# Patient Record
Sex: Female | Born: 1960
Health system: Southern US, Community
[De-identification: ages and names within clinical notes are randomized; demographics above are authoritative.]

## PROBLEM LIST (undated history)

## (undated) DIAGNOSIS — M1990 Unspecified osteoarthritis, unspecified site: Secondary | ICD-10-CM

## (undated) DIAGNOSIS — F419 Anxiety disorder, unspecified: Secondary | ICD-10-CM

## (undated) DIAGNOSIS — M722 Plantar fascial fibromatosis: Secondary | ICD-10-CM

## (undated) DIAGNOSIS — F329 Major depressive disorder, single episode, unspecified: Secondary | ICD-10-CM

## (undated) DIAGNOSIS — Z889 Allergy status to unspecified drugs, medicaments and biological substances status: Secondary | ICD-10-CM

## (undated) DIAGNOSIS — Z87440 Personal history of urinary (tract) infections: Secondary | ICD-10-CM

## (undated) DIAGNOSIS — F410 Panic disorder [episodic paroxysmal anxiety] without agoraphobia: Secondary | ICD-10-CM

## (undated) DIAGNOSIS — F32A Depression, unspecified: Secondary | ICD-10-CM

## (undated) HISTORY — DX: Allergy status to unspecified drugs, medicaments and biological substances: Z88.9

## (undated) HISTORY — DX: Plantar fascial fibromatosis: M72.2

## (undated) HISTORY — PX: CERVICAL CONE BIOPSY: SUR198

---
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
## 1983-08-30 HISTORY — PX: WISDOM TOOTH EXTRACTION: SHX21

## 1987-08-30 HISTORY — PX: REFRACTIVE SURGERY: SHX103

## 2010-02-18 ENCOUNTER — Ambulatory Visit: Payer: Self-pay | Admitting: Sports Medicine

## 2010-02-18 DIAGNOSIS — M79609 Pain in unspecified limb: Secondary | ICD-10-CM

## 2010-09-28 NOTE — Assessment & Plan Note (Signed)
Summary: NP,BILATERAL FOOT PAIN,MC   Vital Signs:  Patient profile:   50 year old female Height:      63 inches Weight:      146 pounds BMI:     25.96 BP sitting:   144 / 96  Vitals Entered By: Lillia Pauls CMA (February 18, 2010 8:48 AM)  History of Present Illness: 50 yo F here for bilateral foot pain.  Patient reports starting a new job several weeks ago. Concurrent with this for past 5 weeks has had bilateral foot pain from ankles distally worse as day goes on. 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 No known injury Tingling feeling in bilateral feet moreso on the bottoms with this Has tried icing and ibuprofen. Working 8 hour shifts 5 days a week. Impacting her ability to go to the gym. Has tried some dr. Jari Sportsman inserts with a little arch support - feels best in these. Also has pair of sneakers that she feels the best in. Feet more swollen at end of the day. No remote history of foot/ankle surgery or injuries.  Allergies (verified): No Known Drug Allergies  Physical Exam  General:  Well-developed,well-nourished,in no acute distress; alert,appropriate and cooperative throughout examination Msk:  Bilateral ankles/feet: No gross deformity, swelling, or bruising. No focal TTP bilateral feet or ankles (admits no pain currently). FROM ankles and toes. Neg ant drawer and talar tilt. NVI distally Mild cavus feet. Neg tinels bilateral tarsal tunnels.   Impression & Recommendations:  Problem # 1:  FOOT PAIN, BILATERAL (ICD-729.5) Assessment New  No pain currently and no obvious abnormalities.  Related to work where on her feet on concrete all day.  Cushion and arch support only changes to likely be of benefit - states no other job opportunities there and no light duty.  Unable to modify her work - sit as much as possible with feet elevation.  Comforthotics provided - too much supination with scaphoid pads added.  Orders: [employer] [id number])

## 2011-02-05 LAB — HM PAP SMEAR

## 2013-02-04 ENCOUNTER — Ambulatory Visit (INDEPENDENT_AMBULATORY_CARE_PROVIDER_SITE_OTHER): Payer: BC Managed Care – PPO | Admitting: Family Medicine

## 2013-02-04 ENCOUNTER — Encounter: Payer: Self-pay | Admitting: Family Medicine

## 2013-02-04 VITALS — BP 106/74 | HR 68 | Temp 98.9°F | Ht 64.0 in | Wt 150.4 lb

## 2013-02-04 DIAGNOSIS — Z0000 Encounter for general adult medical examination without abnormal findings: Secondary | ICD-10-CM

## 2013-02-04 DIAGNOSIS — F4001 Agoraphobia with panic disorder: Secondary | ICD-10-CM

## 2013-02-04 MED ORDER — ALPRAZOLAM 0.25 MG PO TABS: ORAL_TABLET | ORAL | Status: DC

## 2013-02-04 MED ORDER — AZELASTINE-FLUTICASONE 137-50 MCG/ACT NA SUSP: 1.0000 | Freq: Two times a day (BID) | NASAL | Status: DC

## 2013-02-04 NOTE — Assessment & Plan Note (Signed)
con't with counseling and meds Xanax given to take when she does decide to donate blood in order to try to get over fear of IV

## 2013-02-04 NOTE — Patient Instructions (Addendum)
Preventive Care for Adults, Female A healthy lifestyle and preventive care can promote health and wellness. Preventive health guidelines for women include the following key practices.  A routine yearly physical is a good way to check with your caregiver about your health and preventive screening. It is a chance to share any concerns and updates on your health, and to receive a thorough exam.  Visit your dentist for a routine exam and preventive care every 6 months. Brush your teeth twice a day and floss once a day. Good oral hygiene prevents tooth decay and gum disease.  The frequency of eye exams is based on your age, health, family medical history, use of contact lenses, and other factors. Follow your caregiver's recommendations for frequency of eye exams.  Eat a healthy diet. Foods like vegetables, fruits, whole grains, low-fat dairy products, and lean protein foods contain the nutrients you need without too many calories. Decrease your intake of foods high in solid fats, added sugars, and salt. Eat the right amount of calories for you.Get information about a proper diet from your caregiver, if necessary.  Regular physical exercise is one of the most important things you can do for your health. Most adults should get at least 150 minutes of moderate-intensity exercise (any activity that increases your heart rate and causes you to sweat) each week. In addition, most adults need muscle-strengthening exercises on 2 or more days a week.  Maintain a healthy weight. The body mass index (BMI) is a screening tool to identify possible weight problems. It provides an estimate of body fat based on height and weight. Your caregiver can help determine your BMI, and can help you achieve or maintain a healthy weight.For adults 20 years and older:  A BMI below 18.5 is considered underweight.  A BMI of 18.5 to 24.9 is normal.  A BMI of 25 to 29.9 is considered overweight.  A BMI of 30 and above is  considered obese.  Maintain normal blood lipids and cholesterol levels by exercising and minimizing your intake of saturated fat. Eat a balanced diet with plenty of fruit and vegetables. Blood tests for lipids and cholesterol should begin at age 20 and be repeated every 5 years. If your lipid or cholesterol levels are high, you are over 50, or you are at high risk for heart disease, you may need your cholesterol levels checked more frequently.Ongoing high lipid and cholesterol levels should be treated with medicines if diet and exercise are not effective.  If you smoke, find out from your caregiver how to quit. If you do not use tobacco, do not start.  If you are pregnant, do not drink alcohol. If you are breastfeeding, be very cautious about drinking alcohol. If you are not pregnant and choose to drink alcohol, do not exceed 1 drink per day. One drink is considered to be 12 ounces (355 mL) of beer, 5 ounces (148 mL) of wine, or 1.5 ounces (44 mL) of liquor.  Avoid use of street drugs. Do not share needles with anyone. Ask for help if you need support or instructions about stopping the use of drugs.  High blood pressure causes heart disease and increases the risk of stroke. Your blood pressure should be checked at least every 1 to 2 years. Ongoing high blood pressure should be treated with medicines if weight loss and exercise are not effective.  If you are 55 to 50 years old, ask your caregiver if you should take aspirin to prevent strokes.  Diabetes   screening involves taking a blood sample to check your fasting blood sugar level. This should be done once every 3 years, after age 45, if you are within normal weight and without risk factors for diabetes. Testing should be considered at a younger age or be carried out more frequently if you are overweight and have at least 1 risk factor for diabetes.  Breast cancer screening is essential preventive care for women. You should practice "breast  self-awareness." This means understanding the normal appearance and feel of your breasts and may include breast self-examination. Any changes detected, no matter how small, should be reported to a caregiver. Women in their 20s and 30s should have a clinical breast exam (CBE) by a caregiver as part of a regular health exam every 1 to 3 years. After age 40, women should have a CBE every year. Starting at age 40, women should consider having a mammography (breast X-ray test) every year. Women who have a family history of breast cancer should talk to their caregiver about genetic screening. Women at a high risk of breast cancer should talk to their caregivers about having magnetic resonance imaging (MRI) and a mammography every year.  The Pap test is a screening test for cervical cancer. A Pap test can show cell changes on the cervix that might become cervical cancer if left untreated. A Pap test is a procedure in which cells are obtained and examined from the lower end of the uterus (cervix).  Women should have a Pap test starting at age 21.  Between ages 21 and 29, Pap tests should be repeated every 2 years.  Beginning at age 30, you should have a Pap test every 3 years as long as the past 3 Pap tests have been normal.  Some women have medical problems that increase the chance of getting cervical cancer. Talk to your caregiver about these problems. It is especially important to talk to your caregiver if a new problem develops soon after your last Pap test. In these cases, your caregiver may recommend more frequent screening and Pap tests.  The above recommendations are the same for women who have or have not gotten the vaccine for human papillomavirus (HPV).  If you had a hysterectomy for a problem that was not cancer or a condition that could lead to cancer, then you no longer need Pap tests. Even if you no longer need a Pap test, a regular exam is a good idea to make sure no other problems are  starting.  If you are between ages 65 and 70, and you have had normal Pap tests going back 10 years, you no longer need Pap tests. Even if you no longer need a Pap test, a regular exam is a good idea to make sure no other problems are starting.  If you have had past treatment for cervical cancer or a condition that could lead to cancer, you need Pap tests and screening for cancer for at least 20 years after your treatment.  If Pap tests have been discontinued, risk factors (such as a new sexual partner) need to be reassessed to determine if screening should be resumed.  The HPV test is an additional test that may be used for cervical cancer screening. The HPV test looks for the virus that can cause the cell changes on the cervix. The cells collected during the Pap test can be tested for HPV. The HPV test could be used to screen women aged 30 years and older, and should   be used in women of any age who have unclear Pap test results. After the age of 30, women should have HPV testing at the same frequency as a Pap test.  Colorectal cancer can be detected and often prevented. Most routine colorectal cancer screening begins at the age of 50 and continues through age 75. However, your caregiver may recommend screening at an earlier age if you have risk factors for colon cancer. On a yearly basis, your caregiver may provide home test kits to check for hidden blood in the stool. Use of a small camera at the end of a tube, to directly examine the colon (sigmoidoscopy or colonoscopy), can detect the earliest forms of colorectal cancer. Talk to your caregiver about this at age 50, when routine screening begins. Direct examination of the colon should be repeated every 5 to 10 years through age 75, unless early forms of pre-cancerous polyps or small growths are found.  Hepatitis C blood testing is recommended for all people born from 1945 through 1965 and any individual with known risks for hepatitis C.  Practice  safe sex. Use condoms and avoid high-risk sexual practices to reduce the spread of sexually transmitted infections (STIs). STIs include gonorrhea, chlamydia, syphilis, trichomonas, herpes, HPV, and human immunodeficiency virus (HIV). Herpes, HIV, and HPV are viral illnesses that have no cure. They can result in disability, cancer, and death. Sexually active women aged 25 and younger should be checked for chlamydia. Older women with new or multiple partners should also be tested for chlamydia. Testing for other STIs is recommended if you are sexually active and at increased risk.  Osteoporosis is a disease in which the bones lose minerals and strength with aging. This can result in serious bone fractures. The risk of osteoporosis can be identified using a bone density scan. Women ages 65 and over and women at risk for fractures or osteoporosis should discuss screening with their caregivers. Ask your caregiver whether you should take a calcium supplement or vitamin D to reduce the rate of osteoporosis.  Menopause can be associated with physical symptoms and risks. Hormone replacement therapy is available to decrease symptoms and risks. You should talk to your caregiver about whether hormone replacement therapy is right for you.  Use sunscreen with sun protection factor (SPF) of 30 or more. Apply sunscreen liberally and repeatedly throughout the day. You should seek shade when your shadow is shorter than you. Protect yourself by wearing long sleeves, pants, a wide-brimmed hat, and sunglasses year round, whenever you are outdoors.  Once a month, do a whole body skin exam, using a mirror to look at the skin on your back. Notify your caregiver of new moles, moles that have irregular borders, moles that are larger than a pencil eraser, or moles that have changed in shape or color.  Stay current with required immunizations.  Influenza. You need a dose every fall (or winter). The composition of the flu vaccine  changes each year, so being vaccinated once is not enough.  Pneumococcal polysaccharide. You need 1 to 2 doses if you smoke cigarettes or if you have certain chronic medical conditions. You need 1 dose at age 65 (or older) if you have never been vaccinated.  Tetanus, diphtheria, pertussis (Tdap, Td). Get 1 dose of Tdap vaccine if you are younger than age 65, are over 65 and have contact with an infant, are a healthcare worker, are pregnant, or simply want to be protected from whooping cough. After that, you need a Td   booster dose every 10 years. Consult your caregiver if you have not had at least 3 tetanus and diphtheria-containing shots sometime in your life or have a deep or dirty wound.  HPV. You need this vaccine if you are a woman age 26 or younger. The vaccine is given in 3 doses over 6 months.  Measles, mumps, rubella (MMR). You need at least 1 dose of MMR if you were born in 1957 or later. You may also need a second dose.  Meningococcal. If you are age 19 to 21 and a first-year college student living in a residence hall, or have one of several medical conditions, you need to get vaccinated against meningococcal disease. You may also need additional booster doses.  Zoster (shingles). If you are age 60 or older, you should get this vaccine.  Varicella (chickenpox). If you have never had chickenpox or you were vaccinated but received only 1 dose, talk to your caregiver to find out if you need this vaccine.  Hepatitis A. You need this vaccine if you have a specific risk factor for hepatitis A virus infection or you simply wish to be protected from this disease. The vaccine is usually given as 2 doses, 6 to 18 months apart.  Hepatitis B. You need this vaccine if you have a specific risk factor for hepatitis B virus infection or you simply wish to be protected from this disease. The vaccine is given in 3 doses, usually over 6 months. Preventive Services / Frequency Ages 19 to 39  Blood  pressure check.** / Every 1 to 2 years.  Lipid and cholesterol check.** / Every 5 years beginning at age 20.  Clinical breast exam.** / Every 3 years for women in their 20s and 30s.  Pap test.** / Every 2 years from ages 21 through 29. Every 3 years starting at age 30 through age 65 or 70 with a history of 3 consecutive normal Pap tests.  HPV screening.** / Every 3 years from ages 30 through ages 65 to 70 with a history of 3 consecutive normal Pap tests.  Hepatitis C blood test.** / For any individual with known risks for hepatitis C.  Skin self-exam. / Monthly.  Influenza immunization.** / Every year.  Pneumococcal polysaccharide immunization.** / 1 to 2 doses if you smoke cigarettes or if you have certain chronic medical conditions.  Tetanus, diphtheria, pertussis (Tdap, Td) immunization. / A one-time dose of Tdap vaccine. After that, you need a Td booster dose every 10 years.  HPV immunization. / 3 doses over 6 months, if you are 26 and younger.  Measles, mumps, rubella (MMR) immunization. / You need at least 1 dose of MMR if you were born in 1957 or later. You may also need a second dose.  Meningococcal immunization. / 1 dose if you are age 19 to 21 and a first-year college student living in a residence hall, or have one of several medical conditions, you need to get vaccinated against meningococcal disease. You may also need additional booster doses.  Varicella immunization.** / Consult your caregiver.  Hepatitis A immunization.** / Consult your caregiver. 2 doses, 6 to 18 months apart.  Hepatitis B immunization.** / Consult your caregiver. 3 doses usually over 6 months. Ages 40 to 64  Blood pressure check.** / Every 1 to 2 years.  Lipid and cholesterol check.** / Every 5 years beginning at age 20.  Clinical breast exam.** / Every year after age 40.  Mammogram.** / Every year beginning at age 40   and continuing for as long as you are in good health. Consult with your  caregiver.  Pap test.** / Every 3 years starting at age 30 through age 65 or 70 with a history of 3 consecutive normal Pap tests.  HPV screening.** / Every 3 years from ages 30 through ages 65 to 70 with a history of 3 consecutive normal Pap tests.  Fecal occult blood test (FOBT) of stool. / Every year beginning at age 50 and continuing until age 75. You may not need to do this test if you get a colonoscopy every 10 years.  Flexible sigmoidoscopy or colonoscopy.** / Every 5 years for a flexible sigmoidoscopy or every 10 years for a colonoscopy beginning at age 50 and continuing until age 75.  Hepatitis C blood test.** / For all people born from 1945 through 1965 and any individual with known risks for hepatitis C.  Skin self-exam. / Monthly.  Influenza immunization.** / Every year.  Pneumococcal polysaccharide immunization.** / 1 to 2 doses if you smoke cigarettes or if you have certain chronic medical conditions.  Tetanus, diphtheria, pertussis (Tdap, Td) immunization.** / A one-time dose of Tdap vaccine. After that, you need a Td booster dose every 10 years.  Measles, mumps, rubella (MMR) immunization. / You need at least 1 dose of MMR if you were born in 1957 or later. You may also need a second dose.  Varicella immunization.** / Consult your caregiver.  Meningococcal immunization.** / Consult your caregiver.  Hepatitis A immunization.** / Consult your caregiver. 2 doses, 6 to 18 months apart.  Hepatitis B immunization.** / Consult your caregiver. 3 doses, usually over 6 months. Ages 65 and over  Blood pressure check.** / Every 1 to 2 years.  Lipid and cholesterol check.** / Every 5 years beginning at age 20.  Clinical breast exam.** / Every year after age 40.  Mammogram.** / Every year beginning at age 40 and continuing for as long as you are in good health. Consult with your caregiver.  Pap test.** / Every 3 years starting at age 30 through age 65 or 70 with a 3  consecutive normal Pap tests. Testing can be stopped between 65 and 70 with 3 consecutive normal Pap tests and no abnormal Pap or HPV tests in the past 10 years.  HPV screening.** / Every 3 years from ages 30 through ages 65 or 70 with a history of 3 consecutive normal Pap tests. Testing can be stopped between 65 and 70 with 3 consecutive normal Pap tests and no abnormal Pap or HPV tests in the past 10 years.  Fecal occult blood test (FOBT) of stool. / Every year beginning at age 50 and continuing until age 75. You may not need to do this test if you get a colonoscopy every 10 years.  Flexible sigmoidoscopy or colonoscopy.** / Every 5 years for a flexible sigmoidoscopy or every 10 years for a colonoscopy beginning at age 50 and continuing until age 75.  Hepatitis C blood test.** / For all people born from 1945 through 1965 and any individual with known risks for hepatitis C.  Osteoporosis screening.** / A one-time screening for women ages 65 and over and women at risk for fractures or osteoporosis.  Skin self-exam. / Monthly.  Influenza immunization.** / Every year.  Pneumococcal polysaccharide immunization.** / 1 dose at age 65 (or older) if you have never been vaccinated.  Tetanus, diphtheria, pertussis (Tdap, Td) immunization. / A one-time dose of Tdap vaccine if you are over   65 and have contact with an infant, are a healthcare worker, or simply want to be protected from whooping cough. After that, you need a Td booster dose every 10 years.  Varicella immunization.** / Consult your caregiver.  Meningococcal immunization.** / Consult your caregiver.  Hepatitis A immunization.** / Consult your caregiver. 2 doses, 6 to 18 months apart.  Hepatitis B immunization.** / Check with your caregiver. 3 doses, usually over 6 months. ** Family history and personal history of risk and conditions may change your caregiver's recommendations. Document Released: 10/11/2001 Document Revised: 11/07/2011  Document Reviewed: 01/10/2011 [employer] Patient Information 2014 [employer].  

## 2013-02-04 NOTE — Addendum Note (Signed)
Addended by: Lelon Perla on: 02/04/2013 02:01 PM   Modules accepted: Orders

## 2013-02-04 NOTE — Progress Notes (Signed)
Subjective:     Cynthia [name] is a 52 y.o. female and is here for a comprehensive physical exam. 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  History   Social History  . Marital Status: Married    Spouse Name: N/A    Number of Children: N/A  . Years of Education: N/A   Occupational History  .      physical therapy   Social History Main Topics  . Smoking status: Never Smoker   . Smokeless tobacco: Never Used  . Alcohol Use: No  . Drug Use: No  . Sexually Active: Yes -- Female partner(s)   Other Topics Concern  . Not on file   Social History Narrative   Exercise--gym 5-6 days a week   No health maintenance topics applied.  The following portions of the patient's history were reviewed and updated as appropriate:  She  has a past medical history of Multiple allergies. She  does not have any pertinent problems on file. She  has past surgical history that includes Appendectomy (1974); Wisdom tooth extraction (1985); and Refractive surgery (1989). Her family history includes Diabetes (age of onset: 23) in her father and Hypertension in her mother. She  reports that she has never smoked. She has never used smokeless tobacco. She reports that she does not drink alcohol or use illicit drugs. She has a current medication list which includes the following prescription(s): chlorpheniramine maleate, citalopram, lidocaine, and multiple vitamins-minerals. No current outpatient prescriptions on file prior to visit.   No current facility-administered medications on file prior to visit.   She has No Known Allergies..  Review of Systems Review of Systems  Constitutional: Negative for activity change, appetite change and fatigue.  HENT: Negative for hearing loss, congestion, tinnitus and ear discharge.  dentist q64m Eyes: Negative for visual disturbance (see optho q1y -- vision corrected to 20/20 with glasses).  Respiratory: Negative for cough, chest tightness and shortness of breath.    Cardiovascular: Negative for chest pain, palpitations and leg swelling.  Gastrointestinal: Negative for abdominal pain, diarrhea, constipation and abdominal distention.  Genitourinary: Negative for urgency, frequency, decreased urine volume and difficulty urinating.  Musculoskeletal: Negative for back pain, arthralgias and gait problem.  Skin: Negative for color change, pallor and rash.  Neurological: Negative for dizziness, light-headedness, numbness and headaches.  Hematological: Negative for adenopathy. Does not bruise/bleed easily.  Psychiatric/Behavioral: Negative for suicidal ideas, confusion, sleep disturbance, self-injury, dysphoric mood, decreased concentration and agitation.      Objective:    BP 106/74  Pulse 68  Temp(Src) 98.9 F (37.2 C) (Oral)  Ht 5\' 4"  (1.626 m)  Wt 150 lb 6.4 oz (68.221 kg)  BMI 25.8 kg/m2  SpO2 97% General appearance: alert, cooperative, appears stated age and no distress Head: Normocephalic, without obvious abnormality, atraumatic Eyes: conjunctivae/corneas clear. PERRL, EOM's intact. Fundi benign. Ears: normal TM's and external ear canals both ears Nose: Nares normal. Septum midline. Mucosa normal. No drainage or sinus tenderness. Throat: lips, mucosa, and tongue normal; teeth and gums normal Neck: no adenopathy, no carotid bruit, no JVD, supple, symmetrical, trachea midline and thyroid not enlarged, symmetric, no tenderness/mass/nodules Back: symmetric, no curvature. ROM normal. No CVA tenderness. Lungs: clear to auscultation bilaterally Breasts: normal appearance, no masses or tenderness Heart: regular rate and rhythm, S1, S2 normal, no murmur, click, rub or gallop Abdomen: soft, non-tender; bowel sounds normal; no masses,  no organomegaly Pelvic: deferred Extremities: extremities normal, atraumatic, no cyanosis or edema Pulses: 2+ and symmetric Skin: Skin  color, texture, turgor normal. No rashes or lesions Lymph nodes: Cervical,  supraclavicular, and axillary nodes normal. Neurologic: Alert and oriented X 3, normal strength and tone. Normal symmetric reflexes. Normal coordination and gait Psych-- no depression--- Pt IV phobia      Assessment:    Healthy female exam.       Plan:    ghm utd Check labs See After Visit Summary for Counseling Recommendations

## 2013-07-29 ENCOUNTER — Telehealth: Payer: Self-pay | Admitting: Family Medicine

## 2013-07-29 NOTE — Telephone Encounter (Signed)
Last seen 02/04/13 Rx not prescribed at that time. Please advise       KP

## 2013-07-29 NOTE — Telephone Encounter (Signed)
Patent would need an apt per Dr.Lowne.       KP

## 2013-07-29 NOTE — Telephone Encounter (Signed)
She would need to come in to discuss

## 2013-07-29 NOTE — Telephone Encounter (Signed)
Patient is calling to request a new rx for Lidocaine patches. States that she has used them in the past.

## 2013-08-01 ENCOUNTER — Telehealth: Payer: Self-pay | Admitting: *Deleted

## 2013-08-01 ENCOUNTER — Other Ambulatory Visit: Payer: Self-pay | Admitting: *Deleted

## 2013-08-01 NOTE — Telephone Encounter (Signed)
Error

## 2013-08-01 NOTE — Telephone Encounter (Signed)
Patient returned call to office and was made aware that the prior authorization could not be done. She states that she fully understands and if she has another flare up she would then make an appointment.

## 2013-08-01 NOTE — Telephone Encounter (Signed)
Patient called today stating that she went [place] this week and was seen at an urgent care and was prescribed lidocaine patches. Patient states that she will need a prior authorization done for this medication and would need to send it to the [facility] on [address]. Called patient to advise her that we will not be able to to do that prior authorization because Dr. Laury Axon was not the prescribing physician. Left message for patient to return call office for me to explain this to her.

## 2014-04-21 ENCOUNTER — Encounter: Payer: Self-pay | Admitting: Family Medicine

## 2014-04-21 ENCOUNTER — Other Ambulatory Visit (HOSPITAL_COMMUNITY)
Admission: RE | Admit: 2014-04-21 | Discharge: 2014-04-21 | Disposition: A | Discharge status: Home / Self Care | Payer: BC Managed Care – PPO | Source: Ambulatory Visit | Attending: Family Medicine | Admitting: Family Medicine

## 2014-04-21 ENCOUNTER — Ambulatory Visit (INDEPENDENT_AMBULATORY_CARE_PROVIDER_SITE_OTHER): Payer: BC Managed Care – PPO | Admitting: Family Medicine

## 2014-04-21 ENCOUNTER — Other Ambulatory Visit: Payer: Self-pay

## 2014-04-21 VITALS — BP 108/70 | HR 70 | Temp 98.3°F | Ht 63.5 in | Wt 152.0 lb

## 2014-04-21 DIAGNOSIS — F419 Anxiety disorder, unspecified: Secondary | ICD-10-CM

## 2014-04-21 DIAGNOSIS — Z124 Encounter for screening for malignant neoplasm of cervix: Secondary | ICD-10-CM

## 2014-04-21 DIAGNOSIS — Z23 Encounter for immunization: Secondary | ICD-10-CM

## 2014-04-21 DIAGNOSIS — Z01419 Encounter for gynecological examination (general) (routine) without abnormal findings: Secondary | ICD-10-CM | POA: Diagnosis not present

## 2014-04-21 DIAGNOSIS — Z1151 Encounter for screening for human papillomavirus (HPV): Secondary | ICD-10-CM | POA: Insufficient documentation

## 2014-04-21 DIAGNOSIS — Z0000 Encounter for general adult medical examination without abnormal findings: Secondary | ICD-10-CM

## 2014-04-21 DIAGNOSIS — Z2911 Encounter for prophylactic immunotherapy for respiratory syncytial virus (RSV): Secondary | ICD-10-CM

## 2014-04-21 DIAGNOSIS — F411 Generalized anxiety disorder: Secondary | ICD-10-CM

## 2014-04-21 DIAGNOSIS — Z1231 Encounter for screening mammogram for malignant neoplasm of breast: Secondary | ICD-10-CM

## 2014-04-21 MED ORDER — CITALOPRAM HYDROBROMIDE 10 MG PO TABS: ORAL_TABLET | ORAL | Status: DC

## 2014-04-21 MED ORDER — NONFORMULARY OR COMPOUNDED ITEM: Status: DC

## 2014-04-21 NOTE — Addendum Note (Signed)
Addended by: Arnette Norris on: 04/21/2014 04:34 PM   Modules accepted: Orders

## 2014-04-21 NOTE — Progress Notes (Signed)
Pre visit review using our clinic review tool, if applicable. No additional management support is needed unless otherwise documented below in the visit note. 

## 2014-04-21 NOTE — Progress Notes (Addendum)
Subjective:     Cynthia [name] is a 53 y.o. female and is here for a comprehensive physical exam. The patient reports no problems.  History   Social History  . Marital Status: Married    Spouse Name: N/A    Number of Children: N/A  . Years of Education: N/A   Occupational History  .      physical therapy   Social History Main Topics  . Smoking status: Never Smoker   . Smokeless tobacco: Never Used  . Alcohol Use: No  . Drug Use: No  . Sexual Activity: Yes    Partners: Male   Other Topics Concern  . Not on file   Social History Narrative   Exercise--gym 5-6 days a week   Health Maintenance  Topic Date Due  . Colonoscopy  05/10/1981  . Mammogram  01/27/2013  . Pap Smear  02/04/2014  . Influenza Vaccine  05/22/2014 (Originally 03/29/2014)  . Tetanus/tdap  05/29/2022    The following portions of the patient's history were reviewed and updated as appropriate:  She  has a past medical history of Multiple allergies and Plantar fasciitis. She  does not have any pertinent problems on file. She  has past surgical history that includes Appendectomy (1974); Wisdom tooth extraction (1985); and Refractive surgery (1989). Her family history includes Diabetes (age of onset: 36) in her father; Hypertension in her mother. She  reports that she has never smoked. She has never used smokeless tobacco. She reports that she does not drink alcohol or use illicit drugs. She has a current medication list which includes the following prescription(s): alprazolam, citalopram, and multiple vitamins-minerals. Current Outpatient Prescriptions on File Prior to Visit  Medication Sig Dispense Refill  . ALPRAZolam (XANAX) 0.25 MG tablet 1 po 30 min before procedure and tid prn  30 tablet  0  . citalopram (CELEXA) 10 MG tablet Take 5-10 mg by mouth daily.      . Multiple Vitamins-Minerals (MULTIVITAMIN PO) Take 1 tablet by mouth daily.       No current facility-administered medications on file prior  to visit.   She has No Known Allergies..  Review of Systems Review of Systems  Constitutional: Negative for activity change, appetite change and fatigue.  HENT: Negative for hearing loss, congestion, tinnitus and ear discharge.  dentist q51m Eyes: Negative for visual disturbance (see optho q1y -- vision corrected to 20/20 with glasses).  Respiratory: Negative for cough, chest tightness and shortness of breath.   Cardiovascular: Negative for chest pain, palpitations and leg swelling.  Gastrointestinal: Negative for abdominal pain, diarrhea, constipation and abdominal distention.  Genitourinary: Negative for urgency, frequency, decreased urine volume and difficulty urinating.  Musculoskeletal: Negative for back pain, arthralgias and gait problem.  Skin: Negative for color change, pallor and rash.  Neurological: Negative for dizziness, light-headedness, numbness and headaches.  Hematological: Negative for adenopathy. Does not bruise/bleed easily.  Psychiatric/Behavioral: Negative for suicidal ideas, confusion, sleep disturbance, self-injury, dysphoric mood, decreased concentration and agitation.       Objective:    BP 108/70  Pulse 70  Temp(Src) 98.3 F (36.8 C) (Oral)  Ht 5' 3.5" (1.613 m)  Wt 152 lb (68.947 kg)  BMI 26.50 kg/m2  SpO2 98%  LMP 04/07/2014 General appearance: alert, cooperative, appears stated age and no distress Head: Normocephalic, without obvious abnormality, atraumatic Eyes: conjunctivae/corneas clear. PERRL, EOM's intact. Fundi benign. Ears: normal TM's and external ear canals both ears Nose: Nares normal. Septum midline. Mucosa normal. No drainage or sinus  tenderness. Throat: lips, mucosa, and tongue normal; teeth and gums normal Neck: no adenopathy, no carotid bruit, no JVD, supple, symmetrical, trachea midline and thyroid not enlarged, symmetric, no tenderness/mass/nodules Back: symmetric, no curvature. ROM normal. No CVA tenderness. Lungs: clear to  auscultation bilaterally Breasts: normal appearance, no masses or tenderness Heart: regular rate and rhythm, S1, S2 normal, no murmur, click, rub or gallop Abdomen: soft, non-tender; bowel sounds normal; no masses,  no organomegaly Pelvic: cervix normal in appearance, external genitalia normal, no adnexal masses or tenderness, no cervical motion tenderness, rectovaginal septum normal, uterus normal size, shape, and consistency, vagina normal without discharge and pap done, rectal -heme neg brown stool Extremities: extremities normal, atraumatic, no cyanosis or edema Pulses: 2+ and symmetric Skin: Skin color, texture, turgor normal. No rashes or lesions Lymph nodes: Cervical, supraclavicular, and axillary nodes normal. Neurologic: Alert and oriented X 3, normal strength and tone. Normal symmetric reflexes. Normal coordination and gait Psych- no anxiety      Assessment:    Healthy female exam.       Plan:     See After Visit Summary for Counseling Recommendations   1. Other screening mammogram   - MM Digital Screening; Future  2. Preventative health care Pt refusing colonoscopy at this time  - Basic metabolic panel - CBC with Differential - Hepatic function panel - Lipid panel - POCT urinalysis dipstick - TSH - NONFORMULARY OR COMPOUNDED ITEM; Cbcd, bmp, hep, lipid, TSH  uA  Dx V70.0  Dispense: 1 each; Refill: 0  3. Anxiety Refill celexa-- 1/2 10 mg tab - citalopram (CELEXA) 10 MG tablet; 1/2 tab po qd  Dispense: 45 tablet; Refill: 3

## 2014-04-21 NOTE — Patient Instructions (Signed)
Preventive Care for Adults A healthy lifestyle and preventive care can promote health and wellness. Preventive health guidelines for women include the following key practices.  A routine yearly physical is a good way to check with your health care provider about your health and preventive screening. It is a chance to share any concerns and updates on your health and to receive a thorough exam.  Visit your dentist for a routine exam and preventive care every 6 months. Brush your teeth twice a day and floss once a day. Good oral hygiene prevents tooth decay and gum disease.  The frequency of eye exams is based on your age, health, family medical history, use of contact lenses, and other factors. Follow your health care provider's recommendations for frequency of eye exams.  Eat a healthy diet. Foods like vegetables, fruits, whole grains, low-fat dairy products, and lean protein foods contain the nutrients you need without too many calories. Decrease your intake of foods high in solid fats, added sugars, and salt. Eat the right amount of calories for you.Get information about a proper diet from your health care provider, if necessary.  Regular physical exercise is one of the most important things you can do for your health. Most adults should get at least 150 minutes of moderate-intensity exercise (any activity that increases your heart rate and causes you to sweat) each week. In addition, most adults need muscle-strengthening exercises on 2 or more days a week.  Maintain a healthy weight. The body mass index (BMI) is a screening tool to identify possible weight problems. It provides an estimate of body fat based on height and weight. Your health care provider can find your BMI and can help you achieve or maintain a healthy weight.For adults 20 years and older:  A BMI below 18.5 is considered underweight.  A BMI of 18.5 to 24.9 is normal.  A BMI of 25 to 29.9 is considered overweight.  A BMI of  30 and above is considered obese.  Maintain normal blood lipids and cholesterol levels by exercising and minimizing your intake of saturated fat. Eat a balanced diet with plenty of fruit and vegetables. Blood tests for lipids and cholesterol should begin at age 76 and be repeated every 5 years. If your lipid or cholesterol levels are high, you are over 50, or you are at high risk for heart disease, you may need your cholesterol levels checked more frequently.Ongoing high lipid and cholesterol levels should be treated with medicines if diet and exercise are not working.  If you smoke, find out from your health care provider how to quit. If you do not use tobacco, do not start.  Lung cancer screening is recommended for adults aged 22-80 years who are at high risk for developing lung cancer because of a history of smoking. A yearly low-dose CT scan of the lungs is recommended for people who have at least a 30-pack-year history of smoking and are a current smoker or have quit within the past 15 years. A pack year of smoking is smoking an average of 1 pack of cigarettes a day for 1 year (for example: 1 pack a day for 30 years or 2 packs a day for 15 years). Yearly screening should continue until the smoker has stopped smoking for at least 15 years. Yearly screening should be stopped for people who develop a health problem that would prevent them from having lung cancer treatment.  If you are pregnant, do not drink alcohol. If you are breastfeeding,  be very cautious about drinking alcohol. If you are not pregnant and choose to drink alcohol, do not have more than 1 drink per day. One drink is considered to be 12 ounces (355 mL) of beer, 5 ounces (148 mL) of wine, or 1.5 ounces (44 mL) of liquor.  Avoid use of street drugs. Do not share needles with anyone. Ask for help if you need support or instructions about stopping the use of drugs.  High blood pressure causes heart disease and increases the risk of  stroke. Your blood pressure should be checked at least every 1 to 2 years. Ongoing high blood pressure should be treated with medicines if weight loss and exercise do not work.  If you are 75-52 years old, ask your health care provider if you should take aspirin to prevent strokes.  Diabetes screening involves taking a blood sample to check your fasting blood sugar level. This should be done once every 3 years, after age 15, if you are within normal weight and without risk factors for diabetes. Testing should be considered at a younger age or be carried out more frequently if you are overweight and have at least 1 risk factor for diabetes.  Breast cancer screening is essential preventive care for women. You should practice "breast self-awareness." This means understanding the normal appearance and feel of your breasts and may include breast self-examination. Any changes detected, no matter how small, should be reported to a health care provider. Women in their 58s and 30s should have a clinical breast exam (CBE) by a health care provider as part of a regular health exam every 1 to 3 years. After age 16, women should have a CBE every year. Starting at age 53, women should consider having a mammogram (breast X-ray test) every year. Women who have a family history of breast cancer should talk to their health care provider about genetic screening. Women at a high risk of breast cancer should talk to their health care providers about having an MRI and a mammogram every year.  Breast cancer gene (BRCA)-related cancer risk assessment is recommended for women who have family members with BRCA-related cancers. BRCA-related cancers include breast, ovarian, tubal, and peritoneal cancers. Having family members with these cancers may be associated with an increased risk for harmful changes (mutations) in the breast cancer genes BRCA1 and BRCA2. Results of the assessment will determine the need for genetic counseling and  BRCA1 and BRCA2 testing.  Routine pelvic exams to screen for cancer are no longer recommended for nonpregnant women who are considered low risk for cancer of the pelvic organs (ovaries, uterus, and vagina) and who do not have symptoms. Ask your health care provider if a screening pelvic exam is right for you.  If you have had past treatment for cervical cancer or a condition that could lead to cancer, you need Pap tests and screening for cancer for at least 20 years after your treatment. If Pap tests have been discontinued, your risk factors (such as having a new sexual partner) need to be reassessed to determine if screening should be resumed. Some women have medical problems that increase the chance of getting cervical cancer. In these cases, your health care provider may recommend more frequent screening and Pap tests.  The HPV test is an additional test that may be used for cervical cancer screening. The HPV test looks for the virus that can cause the cell changes on the cervix. The cells collected during the Pap test can be  tested for HPV. The HPV test could be used to screen women aged 30 years and older, and should be used in women of any age who have unclear Pap test results. After the age of 30, women should have HPV testing at the same frequency as a Pap test.  Colorectal cancer can be detected and often prevented. Most routine colorectal cancer screening begins at the age of 50 years and continues through age 75 years. However, your health care provider may recommend screening at an earlier age if you have risk factors for colon cancer. On a yearly basis, your health care provider may provide home test kits to check for hidden blood in the stool. Use of a small camera at the end of a tube, to directly examine the colon (sigmoidoscopy or colonoscopy), can detect the earliest forms of colorectal cancer. Talk to your health care provider about this at age 50, when routine screening begins. Direct  exam of the colon should be repeated every 5-10 years through age 75 years, unless early forms of pre-cancerous polyps or small growths are found.  People who are at an increased risk for hepatitis B should be screened for this virus. You are considered at high risk for hepatitis B if:  You were born in a country where hepatitis B occurs often. Talk with your health care provider about which countries are considered high risk.  Your parents were born in a high-risk country and you have not received a shot to protect against hepatitis B (hepatitis B vaccine).  You have HIV or AIDS.  You use needles to inject street drugs.  You live with, or have sex with, someone who has hepatitis B.  You get hemodialysis treatment.  You take certain medicines for conditions like cancer, organ transplantation, and autoimmune conditions.  Hepatitis C blood testing is recommended for all people born from 1945 through 1965 and any individual with known risks for hepatitis C.  Practice safe sex. Use condoms and avoid high-risk sexual practices to reduce the spread of sexually transmitted infections (STIs). STIs include gonorrhea, chlamydia, syphilis, trichomonas, herpes, HPV, and human immunodeficiency virus (HIV). Herpes, HIV, and HPV are viral illnesses that have no cure. They can result in disability, cancer, and death.  You should be screened for sexually transmitted illnesses (STIs) including gonorrhea and chlamydia if:  You are sexually active and are younger than 24 years.  You are older than 24 years and your health care provider tells you that you are at risk for this type of infection.  Your sexual activity has changed since you were last screened and you are at an increased risk for chlamydia or gonorrhea. Ask your health care provider if you are at risk.  If you are at risk of being infected with HIV, it is recommended that you take a prescription medicine daily to prevent HIV infection. This is  called preexposure prophylaxis (PrEP). You are considered at risk if:  You are a heterosexual woman, are sexually active, and are at increased risk for HIV infection.  You take drugs by injection.  You are sexually active with a partner who has HIV.  Talk with your health care provider about whether you are at high risk of being infected with HIV. If you choose to begin PrEP, you should first be tested for HIV. You should then be tested every 3 months for as long as you are taking PrEP.  Osteoporosis is a disease in which the bones lose minerals and strength   with aging. This can result in serious bone fractures or breaks. The risk of osteoporosis can be identified using a bone density scan. Women ages 65 years and over and women at risk for fractures or osteoporosis should discuss screening with their health care providers. Ask your health care provider whether you should take a calcium supplement or vitamin D to reduce the rate of osteoporosis.  Menopause can be associated with physical symptoms and risks. Hormone replacement therapy is available to decrease symptoms and risks. You should talk to your health care provider about whether hormone replacement therapy is right for you.  Use sunscreen. Apply sunscreen liberally and repeatedly throughout the day. You should seek shade when your shadow is shorter than you. Protect yourself by wearing long sleeves, pants, a wide-brimmed hat, and sunglasses year round, whenever you are outdoors.  Once a month, do a whole body skin exam, using a mirror to look at the skin on your back. Tell your health care provider of new moles, moles that have irregular borders, moles that are larger than a pencil eraser, or moles that have changed in shape or color.  Stay current with required vaccines (immunizations).  Influenza vaccine. All adults should be immunized every year.  Tetanus, diphtheria, and acellular pertussis (Td, Tdap) vaccine. Pregnant women should  receive 1 dose of Tdap vaccine during each pregnancy. The dose should be obtained regardless of the length of time since the last dose. Immunization is preferred during the 27th-36th week of gestation. An adult who has not previously received Tdap or who does not know her vaccine status should receive 1 dose of Tdap. This initial dose should be followed by tetanus and diphtheria toxoids (Td) booster doses every 10 years. Adults with an unknown or incomplete history of completing a 3-dose immunization series with Td-containing vaccines should begin or complete a primary immunization series including a Tdap dose. Adults should receive a Td booster every 10 years.  Varicella vaccine. An adult without evidence of immunity to varicella should receive 2 doses or a second dose if she has previously received 1 dose. Pregnant females who do not have evidence of immunity should receive the first dose after pregnancy. This first dose should be obtained before leaving the health care facility. The second dose should be obtained 4-8 weeks after the first dose.  Human papillomavirus (HPV) vaccine. Females aged 13-26 years who have not received the vaccine previously should obtain the 3-dose series. The vaccine is not recommended for use in pregnant females. However, pregnancy testing is not needed before receiving a dose. If a female is found to be pregnant after receiving a dose, no treatment is needed. In that case, the remaining doses should be delayed until after the pregnancy. Immunization is recommended for any person with an immunocompromised condition through the age of 26 years if she did not get any or all doses earlier. During the 3-dose series, the second dose should be obtained 4-8 weeks after the first dose. The third dose should be obtained 24 weeks after the first dose and 16 weeks after the second dose.  Zoster vaccine. One dose is recommended for adults aged 60 years or older unless certain conditions are  present.  Measles, mumps, and rubella (MMR) vaccine. Adults born before 1957 generally are considered immune to measles and mumps. Adults born in 1957 or later should have 1 or more doses of MMR vaccine unless there is a contraindication to the vaccine or there is laboratory evidence of immunity to   each of the three diseases. A routine second dose of MMR vaccine should be obtained at least 28 days after the first dose for students attending postsecondary schools, health care workers, or international travelers. People who received inactivated measles vaccine or an unknown type of measles vaccine during 1963-1967 should receive 2 doses of MMR vaccine. People who received inactivated mumps vaccine or an unknown type of mumps vaccine before 1979 and are at high risk for mumps infection should consider immunization with 2 doses of MMR vaccine. For females of childbearing age, rubella immunity should be determined. If there is no evidence of immunity, females who are not pregnant should be vaccinated. If there is no evidence of immunity, females who are pregnant should delay immunization until after pregnancy. Unvaccinated health care workers born before 1957 who lack laboratory evidence of measles, mumps, or rubella immunity or laboratory confirmation of disease should consider measles and mumps immunization with 2 doses of MMR vaccine or rubella immunization with 1 dose of MMR vaccine.  Pneumococcal 13-valent conjugate (PCV13) vaccine. When indicated, a person who is uncertain of her immunization history and has no record of immunization should receive the PCV13 vaccine. An adult aged 19 years or older who has certain medical conditions and has not been previously immunized should receive 1 dose of PCV13 vaccine. This PCV13 should be followed with a dose of pneumococcal polysaccharide (PPSV23) vaccine. The PPSV23 vaccine dose should be obtained at least 8 weeks after the dose of PCV13 vaccine. An adult aged 19  years or older who has certain medical conditions and previously received 1 or more doses of PPSV23 vaccine should receive 1 dose of PCV13. The PCV13 vaccine dose should be obtained 1 or more years after the last PPSV23 vaccine dose.  Pneumococcal polysaccharide (PPSV23) vaccine. When PCV13 is also indicated, PCV13 should be obtained first. All adults aged 65 years and older should be immunized. An adult younger than age 65 years who has certain medical conditions should be immunized. Any person who resides in a nursing home or long-term care facility should be immunized. An adult smoker should be immunized. People with an immunocompromised condition and certain other conditions should receive both PCV13 and PPSV23 vaccines. People with human immunodeficiency virus (HIV) infection should be immunized as soon as possible after diagnosis. Immunization during chemotherapy or radiation therapy should be avoided. Routine use of PPSV23 vaccine is not recommended for American Indians, [place] Natives, or people younger than 65 years unless there are medical conditions that require PPSV23 vaccine. When indicated, people who have unknown immunization and have no record of immunization should receive PPSV23 vaccine. One-time revaccination 5 years after the first dose of PPSV23 is recommended for people aged 19-64 years who have chronic kidney failure, nephrotic syndrome, asplenia, or immunocompromised conditions. People who received 1-2 doses of PPSV23 before age 65 years should receive another dose of PPSV23 vaccine at age 65 years or later if at least 5 years have passed since the previous dose. Doses of PPSV23 are not needed for people immunized with PPSV23 at or after age 65 years.  Meningococcal vaccine. Adults with asplenia or persistent complement component deficiencies should receive 2 doses of quadrivalent meningococcal conjugate (MenACWY-D) vaccine. The doses should be obtained at least 2 months apart.  Microbiologists working with certain meningococcal bacteria, military recruits, people at risk during an outbreak, and people who travel to or live in countries with a high rate of meningitis should be immunized. A first-year college student up through age   21 years who is living in a residence hall should receive a dose if she did not receive a dose on or after her 16th birthday. Adults who have certain high-risk conditions should receive one or more doses of vaccine.  Hepatitis A vaccine. Adults who wish to be protected from this disease, have certain high-risk conditions, work with hepatitis A-infected animals, work in hepatitis A research labs, or travel to or work in countries with a high rate of hepatitis A should be immunized. Adults who were previously unvaccinated and who anticipate close contact with an international adoptee during the first 60 days after arrival in the [place] from a country with a high rate of hepatitis A should be immunized.  Hepatitis B vaccine. Adults who wish to be protected from this disease, have certain high-risk conditions, may be exposed to blood or other infectious body fluids, are household contacts or sex partners of hepatitis B positive people, are clients or workers in certain care facilities, or travel to or work in countries with a high rate of hepatitis B should be immunized.  Haemophilus influenzae type b (Hib) vaccine. A previously unvaccinated person with asplenia or sickle cell disease or having a scheduled splenectomy should receive 1 dose of Hib vaccine. Regardless of previous immunization, a recipient of a hematopoietic stem cell transplant should receive a 3-dose series 6-12 months after her successful transplant. Hib vaccine is not recommended for adults with HIV infection. Preventive Services / Frequency Ages 64 to 68 years  Blood pressure check.** / Every 1 to 2 years.  Lipid and cholesterol check.** / Every 5 years beginning at age  22.  Clinical breast exam.** / Every 3 years for women in their 88s and 53s.  BRCA-related cancer risk assessment.** / For women who have family members with a BRCA-related cancer (breast, ovarian, tubal, or peritoneal cancers).  Pap test.** / Every 2 years from ages 90 through 51. Every 3 years starting at age 21 through age 56 or 3 with a history of 3 consecutive normal Pap tests.  HPV screening.** / Every 3 years from ages 24 through ages 1 to 46 with a history of 3 consecutive normal Pap tests.  Hepatitis C blood test.** / For any individual with known risks for hepatitis C.  Skin self-exam. / Monthly.  Influenza vaccine. / Every year.  Tetanus, diphtheria, and acellular pertussis (Tdap, Td) vaccine.** / Consult your health care provider. Pregnant women should receive 1 dose of Tdap vaccine during each pregnancy. 1 dose of Td every 10 years.  Varicella vaccine.** / Consult your health care provider. Pregnant females who do not have evidence of immunity should receive the first dose after pregnancy.  HPV vaccine. / 3 doses over 6 months, if 72 and younger. The vaccine is not recommended for use in pregnant females. However, pregnancy testing is not needed before receiving a dose.  Measles, mumps, rubella (MMR) vaccine.** / You need at least 1 dose of MMR if you were born in 1957 or later. You may also need a 2nd dose. For females of childbearing age, rubella immunity should be determined. If there is no evidence of immunity, females who are not pregnant should be vaccinated. If there is no evidence of immunity, females who are pregnant should delay immunization until after pregnancy.  Pneumococcal 13-valent conjugate (PCV13) vaccine.** / Consult your health care provider.  Pneumococcal polysaccharide (PPSV23) vaccine.** / 1 to 2 doses if you smoke cigarettes or if you have certain conditions.  Meningococcal vaccine.** /  1 dose if you are age 19 to 21 years and a first-year college  student living in a residence hall, or have one of several medical conditions, you need to get vaccinated against meningococcal disease. You may also need additional booster doses.  Hepatitis A vaccine.** / Consult your health care provider.  Hepatitis B vaccine.** / Consult your health care provider.  Haemophilus influenzae type b (Hib) vaccine.** / Consult your health care provider. Ages 40 to 64 years  Blood pressure check.** / Every 1 to 2 years.  Lipid and cholesterol check.** / Every 5 years beginning at age 20 years.  Lung cancer screening. / Every year if you are aged 55-80 years and have a 30-pack-year history of smoking and currently smoke or have quit within the past 15 years. Yearly screening is stopped once you have quit smoking for at least 15 years or develop a health problem that would prevent you from having lung cancer treatment.  Clinical breast exam.** / Every year after age 40 years.  BRCA-related cancer risk assessment.** / For women who have family members with a BRCA-related cancer (breast, ovarian, tubal, or peritoneal cancers).  Mammogram.** / Every year beginning at age 40 years and continuing for as long as you are in good health. Consult with your health care provider.  Pap test.** / Every 3 years starting at age 30 years through age 65 or 70 years with a history of 3 consecutive normal Pap tests.  HPV screening.** / Every 3 years from ages 30 years through ages 65 to 70 years with a history of 3 consecutive normal Pap tests.  Fecal occult blood test (FOBT) of stool. / Every year beginning at age 50 years and continuing until age 75 years. You may not need to do this test if you get a colonoscopy every 10 years.  Flexible sigmoidoscopy or colonoscopy.** / Every 5 years for a flexible sigmoidoscopy or every 10 years for a colonoscopy beginning at age 50 years and continuing until age 75 years.  Hepatitis C blood test.** / For all people born from 1945 through  1965 and any individual with known risks for hepatitis C.  Skin self-exam. / Monthly.  Influenza vaccine. / Every year.  Tetanus, diphtheria, and acellular pertussis (Tdap/Td) vaccine.** / Consult your health care provider. Pregnant women should receive 1 dose of Tdap vaccine during each pregnancy. 1 dose of Td every 10 years.  Varicella vaccine.** / Consult your health care provider. Pregnant females who do not have evidence of immunity should receive the first dose after pregnancy.  Zoster vaccine.** / 1 dose for adults aged 60 years or older.  Measles, mumps, rubella (MMR) vaccine.** / You need at least 1 dose of MMR if you were born in 1957 or later. You may also need a 2nd dose. For females of childbearing age, rubella immunity should be determined. If there is no evidence of immunity, females who are not pregnant should be vaccinated. If there is no evidence of immunity, females who are pregnant should delay immunization until after pregnancy.  Pneumococcal 13-valent conjugate (PCV13) vaccine.** / Consult your health care provider.  Pneumococcal polysaccharide (PPSV23) vaccine.** / 1 to 2 doses if you smoke cigarettes or if you have certain conditions.  Meningococcal vaccine.** / Consult your health care provider.  Hepatitis A vaccine.** / Consult your health care provider.  Hepatitis B vaccine.** / Consult your health care provider.  Haemophilus influenzae type b (Hib) vaccine.** / Consult your health care provider. Ages 65   years and over  Blood pressure check.** / Every 1 to 2 years.  Lipid and cholesterol check.** / Every 5 years beginning at age 22 years.  Lung cancer screening. / Every year if you are aged 73-80 years and have a 30-pack-year history of smoking and currently smoke or have quit within the past 15 years. Yearly screening is stopped once you have quit smoking for at least 15 years or develop a health problem that would prevent you from having lung cancer  treatment.  Clinical breast exam.** / Every year after age 4 years.  BRCA-related cancer risk assessment.** / For women who have family members with a BRCA-related cancer (breast, ovarian, tubal, or peritoneal cancers).  Mammogram.** / Every year beginning at age 40 years and continuing for as long as you are in good health. Consult with your health care provider.  Pap test.** / Every 3 years starting at age 9 years through age 34 or 91 years with 3 consecutive normal Pap tests. Testing can be stopped between 65 and 70 years with 3 consecutive normal Pap tests and no abnormal Pap or HPV tests in the past 10 years.  HPV screening.** / Every 3 years from ages 57 years through ages 64 or 45 years with a history of 3 consecutive normal Pap tests. Testing can be stopped between 65 and 70 years with 3 consecutive normal Pap tests and no abnormal Pap or HPV tests in the past 10 years.  Fecal occult blood test (FOBT) of stool. / Every year beginning at age 15 years and continuing until age 17 years. You may not need to do this test if you get a colonoscopy every 10 years.  Flexible sigmoidoscopy or colonoscopy.** / Every 5 years for a flexible sigmoidoscopy or every 10 years for a colonoscopy beginning at age 86 years and continuing until age 71 years.  Hepatitis C blood test.** / For all people born from [birth year] through 1963 and any individual with known risks for hepatitis C.  Osteoporosis screening.** / A one-time screening for women ages 83 years and over and women at risk for fractures or osteoporosis.  Skin self-exam. / Monthly.  Influenza vaccine. / Every year.  Tetanus, diphtheria, and acellular pertussis (Tdap/Td) vaccine.** / 1 dose of Td every 10 years.  Varicella vaccine.** / Consult your health care provider.  Zoster vaccine.** / 1 dose for adults aged 61 years or older.  Pneumococcal 13-valent conjugate (PCV13) vaccine.** / Consult your health care provider.  Pneumococcal  polysaccharide (PPSV23) vaccine.** / 1 dose for all adults aged 28 years and older.  Meningococcal vaccine.** / Consult your health care provider.  Hepatitis A vaccine.** / Consult your health care provider.  Hepatitis B vaccine.** / Consult your health care provider.  Haemophilus influenzae type b (Hib) vaccine.** / Consult your health care provider. ** Family history and personal history of risk and conditions may change your health care provider's recommendations. Document Released: 10/11/2001 Document Revised: 12/30/2013 Document Reviewed: 01/10/2011 [facility] Patient Information 2015 [place], [place]. This information is not intended to replace advice given to you by your health care provider. Make sure you discuss any questions you have with your health care provider.

## 2014-04-22 ENCOUNTER — Other Ambulatory Visit: Payer: Self-pay

## 2014-04-22 DIAGNOSIS — F419 Anxiety disorder, unspecified: Secondary | ICD-10-CM

## 2014-04-22 MED ORDER — CITALOPRAM HYDROBROMIDE 10 MG PO TABS: ORAL_TABLET | ORAL | Status: DC

## 2014-04-24 LAB — CYTOLOGY - PAP

## 2014-06-20 ENCOUNTER — Telehealth: Payer: Self-pay | Admitting: Family Medicine

## 2014-06-20 MED ORDER — CITALOPRAM HYDROBROMIDE 20 MG PO TABS: 20.0000 mg | ORAL_TABLET | Freq: Every day | ORAL | Status: DC

## 2014-06-20 NOTE — Telephone Encounter (Signed)
Ok, further d/w PCP

## 2014-06-20 NOTE — Telephone Encounter (Signed)
Spoke with patient and she discussed this previously with Dr.Lowne. She said when she cuts the 10 mg in half it turns into dust because the pill is too small. She stated she would much rather have the 20 mg and cut into quarters as she has done in the past.    KP

## 2014-06-20 NOTE — Telephone Encounter (Signed)
The patient was last seen 04/21/14. She wants to increase the Celexa to 20 mg.  Please advise     KP

## 2014-06-20 NOTE — Telephone Encounter (Signed)
Caller name: Catrinia Relation to pt: self Call back number: [phone] [facility] [address]  Reason for call:   Patient would like to know if she could go back to 20mg  citalopram. She states that the 10mg  is too small to cut in half.

## 2014-06-25 ENCOUNTER — Encounter: Payer: Self-pay | Admitting: Family Medicine

## 2014-12-23 ENCOUNTER — Telehealth: Payer: Self-pay | Admitting: Family Medicine

## 2014-12-23 DIAGNOSIS — Z1211 Encounter for screening for malignant neoplasm of colon: Secondary | ICD-10-CM

## 2014-12-23 NOTE — Telephone Encounter (Signed)
Ok to put referral in but if she now lives in [place] she needs to find a pcp there.

## 2014-12-23 NOTE — Telephone Encounter (Signed)
Caller name: Saniyah Relation to pt: self Call back number: [phone] Pharmacy:  Reason for call:   Would like to proceed with colonoscopy. Is requesting [facility] to be [facility] in [place], [place]. Dr. Valerie SaltsHeather Thieme  [address] [place], [place] [id number] P: [phone] F: [phone]

## 2014-12-23 NOTE — Telephone Encounter (Signed)
To MD to review and advise      KP 

## 2014-12-23 NOTE — Telephone Encounter (Signed)
error 

## 2015-05-25 ENCOUNTER — Ambulatory Visit (INDEPENDENT_AMBULATORY_CARE_PROVIDER_SITE_OTHER): Payer: 59 | Admitting: Family Medicine

## 2015-05-25 ENCOUNTER — Encounter: Payer: Self-pay | Admitting: Family Medicine

## 2015-05-25 VITALS — BP 133/96 | HR 90 | Temp 99.1°F | Ht 63.5 in | Wt 153.0 lb

## 2015-05-25 DIAGNOSIS — M25512 Pain in left shoulder: Secondary | ICD-10-CM | POA: Diagnosis not present

## 2015-05-25 MED ORDER — KETOROLAC TROMETHAMINE 60 MG/2ML IM SOLN
60.0000 mg | Freq: Once | INTRAMUSCULAR | Status: AC
  Administered 2015-05-25: 60 mg via INTRAMUSCULAR

## 2015-05-25 MED ORDER — CARISOPRODOL 350 MG PO TABS: 350.0000 mg | ORAL_TABLET | Freq: Four times a day (QID) | ORAL | Status: DC | PRN

## 2015-05-25 MED ORDER — PREDNISONE 10 MG PO TABS: ORAL_TABLET | ORAL | Status: DC

## 2015-05-25 NOTE — Progress Notes (Signed)
Pre visit review using our clinic review tool, if applicable. No additional management support is needed unless otherwise documented below in the visit note. 

## 2015-05-25 NOTE — Assessment & Plan Note (Signed)
pred taper Soma  Warm compresses rto prn

## 2015-05-25 NOTE — Progress Notes (Signed)
Patient ID: Cynthia [name], female    DOB: [birth year]  Age: 54 y.o. MRN: [id number]    Subjective:  Subjective HPI Cynthia [name] presents for L shoulder / trap pain that started Friday --- she worked out Thursday (shoulders) and pain was severe Friday.  She has been taking otc meds and 52yr old flexeril with no relief.    Review of Systems  Constitutional: Negative for diaphoresis, appetite change, fatigue and unexpected weight change.  Eyes: Negative for pain, redness and visual disturbance.  Respiratory: Negative for cough, chest tightness, shortness of breath and wheezing.   Cardiovascular: Negative for chest pain, palpitations and leg swelling.  Endocrine: Negative for cold intolerance, heat intolerance, polydipsia, polyphagia and polyuria.  Genitourinary: Negative for dysuria, frequency and difficulty urinating.  Musculoskeletal: Positive for arthralgias. Negative for myalgias, joint swelling, gait problem and neck pain.       L trap spasm.  Neurological: Negative for dizziness, light-headedness, numbness and headaches.    History Past Medical History  Diagnosis Date  . Multiple allergies   . Plantar fasciitis     She has past surgical history that includes Appendectomy (1974); Wisdom tooth extraction (1985); and Refractive surgery (1989).   Her family history includes Diabetes (age of onset: 84) in her father; Hypertension in her mother.She reports that she has never smoked. She has never used smokeless tobacco. She reports that she does not drink alcohol or use illicit drugs.  Current Outpatient Prescriptions on File Prior to Visit  Medication Sig Dispense Refill  . ALPRAZolam (XANAX) 0.25 MG tablet 1 po 30 min before procedure and tid prn 30 tablet 0  . Multiple Vitamins-Minerals (MULTIVITAMIN PO) Take 1 tablet by mouth daily.    . NONFORMULARY OR COMPOUNDED ITEM Cbcd, bmp, hep, lipid, TSH  uA  Dx V70.0 1 each 0   No current facility-administered medications on file  prior to visit.     Objective:  Objective Physical Exam  Constitutional: She is oriented to [name], place, and time. She appears well-developed and well-nourished.  HENT:  Head: Normocephalic and atraumatic.  Eyes: Conjunctivae and EOM are normal.  Neck: Normal range of motion. Neck supple. No JVD present. Carotid bruit is not present. No thyromegaly present.  Cardiovascular: Normal rate, regular rhythm and normal heart sounds.   No murmur heard. Pulmonary/Chest: Effort normal and breath sounds normal. No respiratory distress. She has no wheezes. She has no rales. She exhibits no tenderness.  Musculoskeletal: Normal range of motion. She exhibits tenderness. She exhibits no edema.       Left shoulder: She exhibits tenderness, pain and spasm. She exhibits normal range of motion, no swelling and no effusion.       Arms: Neurological: She is alert and oriented to [name], place, and time.  Psychiatric: She has a normal mood and affect. Her behavior is normal.   BP 133/96 mmHg  Pulse 90  Temp(Src) 99.1 F (37.3 C) (Oral)  Ht 5' 3.5" (1.613 m)  Wt 153 lb (69.4 kg)  BMI 26.67 kg/m2  SpO2 100% Wt Readings from Last 3 Encounters:  05/25/15 153 lb (69.4 kg)  04/21/14 152 lb (68.947 kg)  02/04/13 150 lb 6.4 oz (68.221 kg)     No results found for: WBC, HGB, HCT, PLT, GLUCOSE, CHOL, TRIG, HDL, LDLDIRECT, LDLCALC, ALT, AST, NA, K, CL, CREATININE, BUN, CO2, TSH, PSA, INR, GLUF, HGBA1C, MICROALBUR  No results found.   Assessment & Plan:  Plan I have discontinued Ms. Heyne's citalopram. I am also having  her start on predniSONE and carisoprodol. Additionally, I am having her maintain her Multiple Vitamins-Minerals (MULTIVITAMIN PO), ALPRAZolam, NONFORMULARY OR COMPOUNDED ITEM, and BLACK COHOSH PO. We administered ketorolac.  Meds ordered this encounter  Medications  . BLACK COHOSH PO    Sig: Take 1 capsule by mouth daily.  . predniSONE (DELTASONE) 10 MG tablet    Sig: 3 po qd for 3  days then 2 po qd for 3 days the 1 po qd for 3 days    Dispense:  18 tablet    Refill:  0  . carisoprodol (SOMA) 350 MG tablet    Sig: Take 1 tablet (350 mg total) by mouth 4 (four) times daily as needed for muscle spasms.    Dispense:  30 tablet    Refill:  0  . ketorolac (TORADOL) injection 60 mg    Sig:     Problem List Items Addressed This Visit    Left shoulder pain - Primary    pred taper Soma  Warm compresses rto prn      Relevant Medications   predniSONE (DELTASONE) 10 MG tablet   ketorolac (TORADOL) injection 60 mg (Completed)      Follow-up: Return if symptoms worsen or fail to improve.  Loreen Freud, DO

## 2015-05-25 NOTE — Patient Instructions (Signed)

## 2015-06-12 ENCOUNTER — Telehealth: Payer: Self-pay | Admitting: Family Medicine

## 2015-06-12 DIAGNOSIS — Z1231 Encounter for screening mammogram for malignant neoplasm of breast: Secondary | ICD-10-CM

## 2015-06-12 NOTE — Telephone Encounter (Signed)
Patient aware the order is ready for pick up.      KP

## 2015-06-12 NOTE — Telephone Encounter (Signed)
Pt asking if we can print order for her to [facility] [place]. She wants to come pick up the order so she can schedule and have it done. She was due in September. Please call when RX ready @ [phone].

## 2015-06-15 ENCOUNTER — Ambulatory Visit (INDEPENDENT_AMBULATORY_CARE_PROVIDER_SITE_OTHER): Payer: 59 | Admitting: Family Medicine

## 2015-06-15 ENCOUNTER — Encounter: Payer: Self-pay | Admitting: Family Medicine

## 2015-06-15 VITALS — BP 143/96 | Ht 63.0 in | Wt 152.0 lb

## 2015-06-15 VITALS — BP 130/80 | HR 71 | Temp 98.3°F | Wt 156.2 lb

## 2015-06-15 DIAGNOSIS — R87619 Unspecified abnormal cytological findings in specimens from cervix uteri: Secondary | ICD-10-CM | POA: Insufficient documentation

## 2015-06-15 DIAGNOSIS — M25512 Pain in left shoulder: Secondary | ICD-10-CM

## 2015-06-15 DIAGNOSIS — J309 Allergic rhinitis, unspecified: Secondary | ICD-10-CM | POA: Insufficient documentation

## 2015-06-15 DIAGNOSIS — F410 Panic disorder [episodic paroxysmal anxiety] without agoraphobia: Secondary | ICD-10-CM | POA: Insufficient documentation

## 2015-06-15 MED ORDER — DICLOFENAC SODIUM 75 MG PO TBEC: 75.0000 mg | DELAYED_RELEASE_TABLET | Freq: Two times a day (BID) | ORAL | Status: DC

## 2015-06-15 MED ORDER — TRAMADOL HCL 50 MG PO TABS: 50.0000 mg | ORAL_TABLET | Freq: Three times a day (TID) | ORAL | Status: DC | PRN

## 2015-06-15 NOTE — Patient Instructions (Signed)

## 2015-06-15 NOTE — Progress Notes (Signed)
Patient ID: Cynthia SneddonBrenda [name], female    DOB: 10/19/1960  Age: 54 y.o. MRN: [id number]    Subjective:  Subjective HPI Cynthia [name] presents for f/u L shoulder pain.  rom is better but pain is not any better and she can only take meds at night.    Review of Systems  Constitutional: Negative for diaphoresis, appetite change, fatigue and unexpected weight change.  Eyes: Negative for pain, redness and visual disturbance.  Respiratory: Negative for cough, chest tightness, shortness of breath and wheezing.   Cardiovascular: Negative for chest pain, palpitations and leg swelling.  Endocrine: Negative for cold intolerance, heat intolerance, polydipsia, polyphagia and polyuria.  Genitourinary: Negative for dysuria, frequency and difficulty urinating.  Musculoskeletal: Positive for myalgias. Negative for back pain and arthralgias.  Neurological: Negative for dizziness, light-headedness, numbness and headaches.    History Past Medical History  Diagnosis Date  . Multiple allergies   . Plantar fasciitis     She has past surgical history that includes Appendectomy (1974); Wisdom tooth extraction (1985); and Refractive surgery (1989).   Her family history includes Diabetes (age of onset: 1270) in her father; Hypertension in her mother.She reports that she has never smoked. She has never used smokeless tobacco. She reports that she does not drink alcohol or use illicit drugs.  Current Outpatient Prescriptions on File Prior to Visit  Medication Sig Dispense Refill  . BLACK COHOSH PO Take 1 capsule by mouth daily.    . carisoprodol (SOMA) 350 MG tablet Take 1 tablet (350 mg total) by mouth 4 (four) times daily as needed for muscle spasms. 30 tablet 0  . Multiple Vitamins-Minerals (MULTIVITAMIN PO) Take 1 tablet by mouth daily.     No current facility-administered medications on file prior to visit.     Objective:  Objective Physical Exam  Constitutional: She is oriented to person, place, and  time. She appears well-developed and well-nourished.  HENT:  Head: Normocephalic and atraumatic.  Eyes: Conjunctivae and EOM are normal.  Neck: Normal range of motion. Neck supple. No JVD present. Carotid bruit is not present. No thyromegaly present.  Cardiovascular: Normal rate, regular rhythm and normal heart sounds.   No murmur heard. Pulmonary/Chest: Effort normal and breath sounds normal. No respiratory distress. She has no wheezes. She has no rales. She exhibits no tenderness.  Musculoskeletal: She exhibits tenderness. She exhibits no edema.       Right shoulder: She exhibits decreased range of motion, tenderness, pain and spasm.       Arms: Neurological: She is alert and oriented to person, place, and time.  Psychiatric: She has a normal mood and affect.   BP 130/80 mmHg  Pulse 71  Temp(Src) 98.3 F (36.8 C) (Oral)  Wt 156 lb 3.2 oz (70.852 kg)  SpO2 99% Wt Readings from Last 3 Encounters:  06/15/15 156 lb 3.2 oz (70.852 kg)  05/25/15 153 lb (69.4 kg)  04/21/14 152 lb (68.947 kg)     No results found for: WBC, HGB, HCT, PLT, GLUCOSE, CHOL, TRIG, HDL, LDLDIRECT, LDLCALC, ALT, AST, NA, K, CL, CREATININE, BUN, CO2, TSH, PSA, INR, GLUF, HGBA1C, MICROALBUR  No results found.   Assessment & Plan:  Plan I have discontinued Ms. Oguinn's ALPRAZolam, NONFORMULARY OR COMPOUNDED ITEM, and predniSONE. I am also having her start on traMADol. Additionally, I am having her maintain her Multiple Vitamins-Minerals (MULTIVITAMIN PO), BLACK COHOSH PO, and carisoprodol.  Meds ordered this encounter  Medications  . traMADol (ULTRAM) 50 MG tablet    Sig: Take  1 tablet (50 mg total) by mouth every 8 (eight) hours as needed.    Dispense:  30 tablet    Refill:  0    Problem List Items Addressed This Visit    Left shoulder pain - Primary   Relevant Medications   traMADol (ULTRAM) 50 MG tablet   Other Relevant Orders   Ambulatory referral to Sports Medicine      Follow-up: Return if  symptoms worsen or fail to improve.  Loreen Freud, DO

## 2015-06-15 NOTE — Progress Notes (Signed)
Pre visit review using our clinic review tool, if applicable. No additional management support is needed unless otherwise documented below in the visit note. 

## 2015-06-15 NOTE — Patient Instructions (Addendum)
You certainly have muscle strain/spasms of scapular stabilizer muscles. This can be the result of cervical nerve root irritation as well though without an acute disc herniation. I'd recommend starting with physical therapy for modalities, soft tissue mobilization - you can just do home exercises if not getting benefit from this. Take voltaren 75mg  twice a day with food for pain and inflammation - [facility] [address]. Take the tramadol Dr. Laury AxonLowne prescribed you. Consider imaging of your lower cervical spine - this would explain the radiation into these muscle groups and into your left arm but typically we only do this in preparation for an epidural steroid injection. Follow up with me in 1 month otherwise.

## 2015-06-17 NOTE — Progress Notes (Signed)
PCP and referred by: Loreen FreudYvonne Lowne, DO  Subjective:   HPI: Patient is a 54 y.o. female here for left shoulder pain.  Patient denies known injury. She reports about 2 months ago she began to have posterior left shoulder pain (some lateral as well). Started a couple days after working out but nothing unusual about her workout. Radiation of pain down arm to fingers but no numbness or tingling. Pain level 6/10 currently. Has tried home exercises, prednisone, muscle relaxant, tramadol without much success. No bowel/bladder dysfunction. No prior issues with this shoulder. No skin changes, fever, other complaints.  Past Medical History  Diagnosis Date  . Multiple allergies   . Plantar fasciitis     Current Outpatient Prescriptions on File Prior to Visit  Medication Sig Dispense Refill  . BLACK COHOSH PO Take 1 capsule by mouth daily.    . carisoprodol (SOMA) 350 MG tablet Take 1 tablet (350 mg total) by mouth 4 (four) times daily as needed for muscle spasms. 30 tablet 0  . Multiple Vitamins-Minerals (MULTIVITAMIN PO) Take 1 tablet by mouth daily.    . traMADol (ULTRAM) 50 MG tablet Take 1 tablet (50 mg total) by mouth every 8 (eight) hours as needed. 30 tablet 0   No current facility-administered medications on file prior to visit.    Past Surgical History  Procedure Laterality Date  . Appendectomy  1974  . Wisdom tooth extraction  1985  . Refractive surgery  1989    No Known Allergies  Social History   Social History  . Marital Status: Married    Spouse Name: N/A  . Number of Children: N/A  . Years of Education: N/A   Occupational History  .      physical therapy   Social History Main Topics  . Smoking status: Never Smoker   . Smokeless tobacco: Never Used  . Alcohol Use: No  . Drug Use: No  . Sexual Activity:    Partners: Male   Other Topics Concern  . Not on file   Social History Narrative   Exercise--gym 5-6 days a week    Family History  Problem  Relation Age of Onset  . Diabetes Father 2070  . Hypertension Mother     BP 143/96 mmHg  Ht 5\' 3"  (1.6 m)  Wt 152 lb (68.947 kg)  BMI 26.93 kg/m2  Review of Systems: See HPI above.    Objective:  Physical Exam:  Gen: NAD  Neck: No gross deformity, swelling, bruising. TTP rhomboid, trapezius muscles, along medial scapular border.  No midline/bony TTP. FROM neck minimal pain on flexion. BUE strength 5/5.   Sensation intact to light touch.   2+ equal reflexes in triceps, biceps, brachioradialis tendons. Negative spurlings. NV intact distal BUEs.  Left shoulder: No swelling, ecchymoses.  No gross deformity. No TTP. FROM - pain scapular area noted above with overhead motions. Negative Hawkins, Neers. Negative Speeds, Yergasons. Strength 5/5 with empty can and resisted internal/external rotation. Negative apprehension. NV intact distally.  Right shoulder; FROM without pain.    Assessment & Plan:  1. Left shoulder pain - consistent with spasms/strain of scapular stabilizer muscles (rhomboids, trapezius) though discussed nerve root irritation is a consideration as well.  Start with physical therapy, home exercises and modalities.  Voltaren with tramadol as needed.  Can consider imaging of lower cervical and/or upper thoracic spine if not improving as expected.  F/u in 1 month.

## 2015-06-17 NOTE — Assessment & Plan Note (Signed)
consistent with spasms/strain of scapular stabilizer muscles (rhomboids, trapezius) though discussed nerve root irritation is a consideration as well.  Start with physical therapy, home exercises and modalities.  Voltaren with tramadol as needed.  Can consider imaging of lower cervical and/or upper thoracic spine if not improving as expected.  F/u in 1 month.

## 2015-06-25 LAB — HM MAMMOGRAPHY: HM Mammogram: NEGATIVE

## 2015-07-15 ENCOUNTER — Ambulatory Visit: Payer: 59 | Admitting: Family Medicine

## 2016-04-26 ENCOUNTER — Ambulatory Visit (INDEPENDENT_AMBULATORY_CARE_PROVIDER_SITE_OTHER): Payer: 59 | Admitting: Family Medicine

## 2016-04-26 ENCOUNTER — Encounter: Payer: Self-pay | Admitting: Family Medicine

## 2016-04-26 DIAGNOSIS — M79672 Pain in left foot: Secondary | ICD-10-CM | POA: Diagnosis not present

## 2016-04-26 DIAGNOSIS — M79671 Pain in right foot: Secondary | ICD-10-CM | POA: Diagnosis not present

## 2016-04-28 NOTE — Progress Notes (Signed)
PCP: Donato SchultzYvonne R Lowne Chase, DO  Subjective:   HPI: Patient is a 55 y.o. female here for custom orthotics.  Patient has had problems for several years with feet, ankle pain. 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 Has also been diagnosed with bilateral plantar fasciitis, right metatarsalgia in past. Pain 1/10 at worst plantar feet now. Also has right hip arthritis focally - being treated by ortho. Would like custom orthotics as these helped her ankles and feet previously. No skin changes, numbness.  Past Medical History:  Diagnosis Date  . Multiple allergies   . Plantar fasciitis     Current Outpatient Prescriptions on File Prior to Visit  Medication Sig Dispense Refill  . BLACK COHOSH PO Take 1 capsule by mouth daily.    . Multiple Vitamins-Minerals (MULTIVITAMIN PO) Take 1 tablet by mouth daily.     No current facility-administered medications on file prior to visit.     Past Surgical History:  Procedure Laterality Date  . APPENDECTOMY  1974  . REFRACTIVE SURGERY  1989  . WISDOM TOOTH EXTRACTION  1985    No Known Allergies  Social History   Social History  . Marital status: Married    Spouse name: N/A  . Number of children: N/A  . Years of education: N/A   Occupational History  .  [employer]    physical therapy   Social History Main Topics  . Smoking status: Never Smoker  . Smokeless tobacco: Never Used  . Alcohol use No  . Drug use: No  . Sexual activity: Yes    Partners: Male   Other Topics Concern  . Not on file   Social History Narrative   Exercise--gym 5-6 days a week    Family History  Problem Relation Age of Onset  . Diabetes Father 4970  . Hypertension Mother     BP 130/84   Pulse 72   Ht 5\' 4"  (1.626 m)   Wt 145 lb (65.8 kg)   BMI 24.89 kg/m   Review of Systems: See HPI above.    Objective:  Physical Exam:  Gen: NAD, comfortable in exam room  Bilateral ankles/feet: Mild  cavus.  No hallux valgus, rigidus.  No other abnormalities. Leg lengths equal. FROM ankles without pain. No TTP currently Negative ant drawer and talar tilt.   Negative syndesmotic compression. Thompsons test negative. Negative calcaneal squeeze. NV intact distally.    Assessment & Plan:  1. Bilateral foot pain - history of plantar fasciitis, right metatarsalgia.  Doing well currently - has done well with orthotics in past and would like new ones - made these today.  She is familiar with home exercises, arch binders as well for her PF.  Will consider placing metatarsal pad in addition to orthotics if she continues to have forefoot pain on right despite orthotics.  Patient was fitted for a : standard, cushioned, semi-rigid orthotic. The orthotic was heated and afterward the patient stood on the orthotic blank positioned on the orthotic stand. The patient was positioned in subtalar neutral position and 10 degrees of ankle dorsiflexion in a weight bearing stance. After completion of molding, a stable base was applied to the orthotic blank. The blank was ground to a stable position for weight bearing. Size: 7 blue swirl Base: blue med density EVA Posting: none Additional orthotic padding: none Total prep time 45 minutes.

## 2016-04-28 NOTE — Assessment & Plan Note (Signed)
history of plantar fasciitis, right metatarsalgia.  Doing well currently - has done well with orthotics in past and would like new ones - made these today.  She is familiar with home exercises, arch binders as well for her PF.  Will consider placing metatarsal pad in addition to orthotics if she continues to have forefoot pain on right despite orthotics.  Patient was fitted for a : standard, cushioned, semi-rigid orthotic. The orthotic was heated and afterward the patient stood on the orthotic blank positioned on the orthotic stand. The patient was positioned in subtalar neutral position and 10 degrees of ankle dorsiflexion in a weight bearing stance. After completion of molding, a stable base was applied to the orthotic blank. The blank was ground to a stable position for weight bearing. Size: 7 blue swirl Base: blue med density EVA Posting: none Additional orthotic padding: none Total prep time 45 minutes.

## 2016-06-01 ENCOUNTER — Ambulatory Visit (INDEPENDENT_AMBULATORY_CARE_PROVIDER_SITE_OTHER): Payer: 59 | Admitting: Sports Medicine

## 2016-06-01 DIAGNOSIS — M1611 Unilateral primary osteoarthritis, right hip: Secondary | ICD-10-CM | POA: Diagnosis not present

## 2016-06-07 ENCOUNTER — Telehealth: Payer: Self-pay | Admitting: Family Medicine

## 2016-06-07 DIAGNOSIS — Z1231 Encounter for screening mammogram for malignant neoplasm of breast: Secondary | ICD-10-CM

## 2016-06-07 DIAGNOSIS — Z1239 Encounter for other screening for malignant neoplasm of breast: Secondary | ICD-10-CM

## 2016-06-07 NOTE — Telephone Encounter (Signed)
Pt says that she is having a hip replacement and need a surgical clearance appt. She says that she doesn't currently have a date set for surgery. Providers schedule isn't showing an available date soon. Please assist for scheduling.      CB: [phone]

## 2016-06-07 NOTE — Telephone Encounter (Signed)
Next available 30 minute appointment spot.    KP

## 2016-06-15 NOTE — Telephone Encounter (Signed)
Per providers schedule next available 30 shot isn't until the end of the year. Is it possible that clearance visit could be scheduled with pa or different provider?

## 2016-06-16 NOTE — Telephone Encounter (Signed)
Pt called in she says that she no longer need a surgical clearance appt. But does have to have a mammogram . She would like to have the orders placed at [facility] if possible.    Thanks.

## 2016-06-20 NOTE — Telephone Encounter (Signed)
If just screening mammogram she should not need order ---- she can call and schedule it If she want us to do it-- ok to put order in

## 2016-06-22 NOTE — Addendum Note (Signed)
Addended by: Arnette NorrisPAYNE, Waqas Bruhl P on: 06/22/2016 09:17 AM   Modules accepted: Orders

## 2016-06-22 NOTE — Telephone Encounter (Signed)
Order has been faxed to [facility] [phone].   KP

## 2016-06-23 ENCOUNTER — Telehealth: Payer: Self-pay | Admitting: Family Medicine

## 2016-06-23 NOTE — Telephone Encounter (Signed)
Patient called and stated she was told she needed a mammogram. She requested the order be cent to [facility]. However, the imaging center at [facility] called her. She would like to get the [facility]. Please advise.

## 2016-06-24 NOTE — Telephone Encounter (Signed)
I faxed it to the requested Imaging location already in [place].   KP

## 2016-07-12 ENCOUNTER — Ambulatory Visit (INDEPENDENT_AMBULATORY_CARE_PROVIDER_SITE_OTHER): Payer: 59 | Admitting: Sports Medicine

## 2016-07-12 ENCOUNTER — Ambulatory Visit (INDEPENDENT_AMBULATORY_CARE_PROVIDER_SITE_OTHER): Payer: 59

## 2016-07-12 ENCOUNTER — Telehealth (INDEPENDENT_AMBULATORY_CARE_PROVIDER_SITE_OTHER): Payer: Self-pay

## 2016-07-12 ENCOUNTER — Encounter (INDEPENDENT_AMBULATORY_CARE_PROVIDER_SITE_OTHER): Payer: Self-pay | Admitting: Sports Medicine

## 2016-07-12 VITALS — BP 124/87 | HR 70 | Ht 64.0 in | Wt 145.0 lb

## 2016-07-12 DIAGNOSIS — G8929 Other chronic pain: Secondary | ICD-10-CM | POA: Diagnosis not present

## 2016-07-12 DIAGNOSIS — M545 Low back pain, unspecified: Secondary | ICD-10-CM

## 2016-07-12 DIAGNOSIS — M1611 Unilateral primary osteoarthritis, right hip: Secondary | ICD-10-CM | POA: Diagnosis not present

## 2016-07-12 MED ORDER — METHYLPREDNISOLONE 4 MG PO TBPK: ORAL_TABLET | ORAL | 0 refills | Status: DC

## 2016-07-12 MED ORDER — CYCLOBENZAPRINE HCL 10 MG PO TABS: 10.0000 mg | ORAL_TABLET | Freq: Three times a day (TID) | ORAL | 1 refills | Status: DC | PRN

## 2016-07-12 NOTE — Telephone Encounter (Signed)
I have not seen her since 2014, she has been seeing Berline ChoughRigby and most recently last month with Magnus IvanBlackman, they discussed hip surgery, so should follow up with either of those. thanks

## 2016-07-12 NOTE — Telephone Encounter (Signed)
Noted. Thank You.

## 2016-07-12 NOTE — Progress Notes (Signed)
Cynthia SneddonBrenda [name] - 55 y.o. female MRN [id number]  Date of birth: 1960-11-23  Office Visit Note: Visit Date: 07/12/2016 PCP: Cynthia SchultzYvonne [name] Cynthia Chase, DO Referred by: Cynthia SchultzLowne [name], Cynthia [name], *  Subjective: Chief Complaint  Patient presents with  . Lower Back - Follow-up  . Follow-up     States SI joint pain since Sunday morning.  Any type of movements hurts. Not radiating.   HPI: Acute onset of right low back pain upon awakening on Sunday. She is having localized pain in the right low back & deep radiating pain. This does seem different than her hip pain although does seem similar to prior SI joint dysfunction she has dealt with in the past. Previously responded to SI joint injections. No improvement today with anti-inflammatories in muscle relaxers previously prescribed. ROS: Pt denies any change in bowel or bladder habits, muscle weakness, numbness or falls associated with this pain.. Otherwise per HPI.   Clinical History: Severe/Significant Needle phobia Responds well to Ambien for sedation for procedures  She reports that she has never smoked. She has never used smokeless tobacco.  No results for input(s): HGBA1C, LABURIC in the last 8760 hours.  Assessment & Plan: Visit Diagnoses:  1. Chronic right-sided low back pain without sciatica   2. Arthritis of right hip    Plan: Patient with long-standing history of reported SI joint dysfunction however on MRI of her pelvis there is no sclerosis or edema appreciated within the SI joint itself. The right hip pain that she is been having is absolutely attributable to the focal arthritis that she has a hip but I'm also concerned there may be a radicular component that is contributing to this & may be more of the issue. Prior to undergoing total hip arthroplasty recommend MRI of the low back to evaluate for potential L3/L4 herniated disc that could be causing some of the pain that she is experiencing. We discussed that the mechanical symptoms that she  previously had the snapping & popping with not be improved with addressing any back issues but that was not the most symptomatic or bothersome aspect of her pain it was more the debility & inability to exercise & bend, stoop, and lift. Follow-up: Return for We will plan to follow-up by telephone following MRI.  Meds:  Meds ordered this encounter  Medications  . methylPREDNISolone (MEDROL DOSEPAK) 4 MG TBPK tablet    Sig: Take as directed    Dispense:  21 tablet    Refill:  0  . cyclobenzaprine (FLEXERIL) 10 MG tablet    Sig: Take 1 tablet (10 mg total) by mouth 3 (three) times daily as needed for muscle spasms.    Dispense:  30 tablet    Refill:  1   Procedures: No notes on file   Objective:  VS:  HT:5\' 4"  (162.6 cm)   WT:145 lb (65.8 kg)  BMI:24.9    BP:124/87  HR:70bpm  TEMP: ( )  RESP:  Physical Exam:  Adult female. Uncomfortable but in no acute respiratory distress. Right straight leg raise is positive localizing to the back. Bilateral lower extremities are overall well aligned with symmetrical leg lengths & open ASIS compression test bilaterally. Pelvis is neutral. Lower extremity strength is 5/5. Lower extremity sensation is intact to light touch. Lower cherry reflexes are symmetric. DP & PT pulses 2+/4. No significant pretibial edema. Minor pain with log roll & FADIR.  Imaging: Xr Lumbar Spine 2-3 Views  Result Date: 07/15/2016 Findings: 2V x-ray lumbar spine::  Slight dextroscoliosis but otherwise well aligned. Curvature is < 10.  She has some joint space loss between L4-5 as well as L1-L2 & L3-4. There is a small osteophytic spur within the posterior aspect of the L4-5 interspace. Otherwise back is well aligned. No significant soft tissue findings.  No acute fracture/dislocation.  Impression: Degenerative spurring at L4-5 with slight change at L3/L4.    Past Medical/Family/Surgical/Social History: Medications & Allergies reviewed per EMR Patient Active Problem List    Diagnosis Date Noted  . Arthritis of right hip 07/15/2016  . Chronic right-sided low back pain without sciatica 07/15/2016  . Abnormal Pap smear of cervix 06/15/2015  . Allergic rhinitis 06/15/2015  . Panic attack 06/15/2015  . Left shoulder pain 05/25/2015  . Agoraphobia with panic attacks 02/04/2013  . FOOT PAIN, BILATERAL 02/18/2010   Past Medical History:  Diagnosis Date  . Multiple allergies   . Plantar fasciitis    Family History  Problem Relation Age of Onset  . Diabetes Father [id number]  . Hypertension Mother    Past Surgical History:  Procedure Laterality Date  . APPENDECTOMY  1974  . REFRACTIVE SURGERY  1989  . WISDOM TOOTH EXTRACTION  1985   Social History   Occupational History  .  [employer]    physical therapy   Social History Main Topics  . Smoking status: Never Smoker  . Smokeless tobacco: Never Used  . Alcohol use No  . Drug use: No  . Sexual activity: Yes    Partners: Male

## 2016-07-12 NOTE — Telephone Encounter (Signed)
Pt called and states that she is having significant SI joint pain and that she has had to miss two days of work because of this. She states that she has been in the office twice for injections with FN and that she is in terrible pain

## 2016-07-15 DIAGNOSIS — M545 Low back pain: Principal | ICD-10-CM

## 2016-07-15 DIAGNOSIS — G8929 Other chronic pain: Secondary | ICD-10-CM | POA: Insufficient documentation

## 2016-07-15 DIAGNOSIS — M1611 Unilateral primary osteoarthritis, right hip: Secondary | ICD-10-CM | POA: Insufficient documentation

## 2016-07-25 ENCOUNTER — Encounter (HOSPITAL_COMMUNITY): Payer: Self-pay | Admitting: *Deleted

## 2016-07-25 ENCOUNTER — Other Ambulatory Visit (INDEPENDENT_AMBULATORY_CARE_PROVIDER_SITE_OTHER): Payer: Self-pay | Admitting: Orthopaedic Surgery

## 2016-07-25 ENCOUNTER — Ambulatory Visit
Admission: RE | Admit: 2016-07-25 | Discharge: 2016-07-25 | Disposition: A | Discharge status: Home / Self Care | Payer: 59 | Source: Ambulatory Visit | Attending: Sports Medicine | Admitting: Sports Medicine

## 2016-07-25 DIAGNOSIS — G8929 Other chronic pain: Secondary | ICD-10-CM

## 2016-07-25 DIAGNOSIS — M545 Low back pain: Principal | ICD-10-CM

## 2016-07-25 MED ORDER — ALPRAZOLAM 0.5 MG PO TABS: 0.5000 mg | ORAL_TABLET | Freq: Two times a day (BID) | ORAL | 0 refills | Status: DC | PRN

## 2016-07-25 NOTE — Progress Notes (Signed)
Called into pharmacy

## 2016-07-25 NOTE — Progress Notes (Unsigned)
Will send in Xanax for anxiety prior to surgery

## 2016-07-25 NOTE — Progress Notes (Signed)
Surgery on 12/8. Need ordersin EPIC.  Thanks.

## 2016-07-26 ENCOUNTER — Other Ambulatory Visit (INDEPENDENT_AMBULATORY_CARE_PROVIDER_SITE_OTHER): Payer: Self-pay | Admitting: Physician Assistant

## 2016-07-28 ENCOUNTER — Encounter (HOSPITAL_COMMUNITY): Payer: Self-pay

## 2016-07-28 ENCOUNTER — Encounter (HOSPITAL_COMMUNITY)
Admission: RE | Admit: 2016-07-28 | Discharge: 2016-07-28 | Disposition: A | Discharge status: Home / Self Care | Payer: 59 | Source: Ambulatory Visit | Attending: Orthopaedic Surgery | Admitting: Orthopaedic Surgery

## 2016-07-28 DIAGNOSIS — Z01812 Encounter for preprocedural laboratory examination: Secondary | ICD-10-CM | POA: Diagnosis not present

## 2016-07-28 DIAGNOSIS — M1611 Unilateral primary osteoarthritis, right hip: Secondary | ICD-10-CM | POA: Diagnosis not present

## 2016-07-28 HISTORY — DX: Anxiety disorder, unspecified: F41.9

## 2016-07-28 HISTORY — DX: Personal history of urinary (tract) infections: Z87.440

## 2016-07-28 HISTORY — DX: Depression, unspecified: F32.A

## 2016-07-28 HISTORY — DX: Unspecified osteoarthritis, unspecified site: M19.90

## 2016-07-28 HISTORY — DX: Major depressive disorder, single episode, unspecified: F32.9

## 2016-07-28 HISTORY — DX: Panic disorder (episodic paroxysmal anxiety): F41.0

## 2016-07-28 LAB — CBC
HCT: 38.3 % (ref 36.0–46.0)
HEMOGLOBIN: 13.1 g/dL (ref 12.0–15.0)
MCH: 32 pg (ref 26.0–34.0)
MCHC: 34.2 g/dL (ref 30.0–36.0)
MCV: 93.6 fL (ref 78.0–100.0)
Platelets: 290 10*3/uL (ref 150–400)
RBC: 4.09 MIL/uL (ref 3.87–5.11)
RDW: 11.8 % (ref 11.5–15.5)
WBC: 6.3 10*3/uL (ref 4.0–10.5)

## 2016-07-28 LAB — SURGICAL PCR SCREEN
MRSA, PCR: NEGATIVE
STAPHYLOCOCCUS AUREUS: NEGATIVE

## 2016-07-28 LAB — ABO/RH: ABO/RH(D): A POS

## 2016-07-28 NOTE — Progress Notes (Signed)
Dr Renold DonGermeroth / Anesthesia in to see pt for consult secondary to pts history of phobia with severe panic attacks related to IV's.

## 2016-07-28 NOTE — Patient Instructions (Signed)
Marjory SneddonBrenda Hou  07/28/2016   Your procedure is scheduled on: Friday August 05, 2016  [facility]  [place]vators to 3rd floor to  Short Stay Center at 5:30 AM.  Call this number if you have problems the morning of surgery [phone]   Remember: ONLY 1 PERSON MAY GO WITH YOU TO SHORT STAY TO GET  READY MORNING OF YOUR SURGERY.  Do not eat food or drink liquids :After Midnight.     Take these medicines the morning of surgery with A SIP OF WATER: Alprazolam (Xanax) if needed; Gabapentin                                 You may not have any metal on your body including hair pins and              piercings  Do not wear jewelry, make-up, lotions, powders or perfumes, deodorant             Do not wear nail polish.  Do not shave  48 hours prior to surgery.     Do not bring valuables to the hospital.  IS NOT             RESPONSIBLE   FOR VALUABLES.  Contacts, dentures or bridgework may not be worn into surgery.  Leave suitcase in the car. After surgery it may be brought to your room.                Please read over the following fact sheets you were given:MRSA INFORMATION SHEET  _____________________________________________________________________             [facility] - Preparing for Surgery Before surgery, you can play an important role.  Because skin is not sterile, your skin needs to be as free of germs as possible.  You can reduce the number of germs on your skin by washing with CHG (chlorahexidine gluconate) soap before surgery.  CHG is an antiseptic cleaner which kills germs and bonds with the skin to continue killing germs even after washing. Please DO NOT use if you have an allergy to CHG or antibacterial soaps.  If your skin becomes reddened/irritated stop using the CHG and inform your nurse when you arrive at Short Stay. Do not shave (including legs and underarms) for at least 48 hours prior to the first CHG  shower.  You may shave your face/neck. Please follow these instructions carefully:  1.  Shower with CHG Soap the night before surgery and the  morning of Surgery.  2.  If you choose to wash your hair, wash your hair first as usual with your  normal  shampoo.  3.  After you shampoo, rinse your hair and body thoroughly to remove the  shampoo.                           4.  Use CHG as you would any other liquid soap.  You can apply chg directly  to the skin and wash                       Gently with a scrungie or clean washcloth.  5.  Apply the CHG Soap to your body ONLY FROM THE NECK DOWN.  Do not use on face/ open                           Wound or open sores. Avoid contact with eyes, ears mouth and genitals (private parts).                       Wash face,  Genitals (private parts) with your normal soap.             6.  Wash thoroughly, paying special attention to the area where your surgery  will be performed.  7.  Thoroughly rinse your body with warm water from the neck down.  8.  DO NOT shower/wash with your normal soap after using and rinsing off  the CHG Soap.                9.  Pat yourself dry with a clean towel.            10.  Wear clean pajamas.            11.  Place clean sheets on your bed the night of your first shower and do not  sleep with pets. Day of Surgery : Do not apply any lotions/deodorants the morning of surgery.  Please wear clean clothes to the hospital/surgery center.  FAILURE TO FOLLOW THESE INSTRUCTIONS MAY RESULT IN THE CANCELLATION OF YOUR SURGERY PATIENT SIGNATURE_________________________________  NURSE SIGNATURE__________________________________  ________________________________________________________________________

## 2016-07-29 ENCOUNTER — Telehealth (INDEPENDENT_AMBULATORY_CARE_PROVIDER_SITE_OTHER): Payer: Self-pay | Admitting: *Deleted

## 2016-07-29 ENCOUNTER — Encounter (INDEPENDENT_AMBULATORY_CARE_PROVIDER_SITE_OTHER): Payer: Self-pay | Admitting: Sports Medicine

## 2016-07-29 NOTE — Telephone Encounter (Signed)
Patient reports feeling better after the steroid dose pack. She is not taking gabapentin at this time. Continue to have right hip pain & recommend that she proceed with the right total hip birth plasty with Dr. Magnus IvanBlackman. She'll follow-up with me on an as-needed basis. We did discuss having her try to sign up for my chart at her next visit.

## 2016-07-29 NOTE — Telephone Encounter (Signed)
See message, patient would like you to call her now.

## 2016-07-29 NOTE — Telephone Encounter (Signed)
Pt called and asked to speak to Dr Berline Choughigby, he has called 2x and pt wasn't able to talk then but she is able to talk now.

## 2016-08-05 ENCOUNTER — Inpatient Hospital Stay (HOSPITAL_COMMUNITY): Payer: 59 | Admitting: Certified Registered Nurse Anesthetist

## 2016-08-05 ENCOUNTER — Ambulatory Visit (HOSPITAL_COMMUNITY): Payer: 59

## 2016-08-05 ENCOUNTER — Encounter (HOSPITAL_COMMUNITY): Payer: Self-pay | Admitting: *Deleted

## 2016-08-05 ENCOUNTER — Ambulatory Visit (HOSPITAL_COMMUNITY)
Admission: RE | Admit: 2016-08-05 | Discharge: 2016-08-05 | Disposition: A | Discharge status: Home / Self Care | Payer: 59 | Source: Ambulatory Visit | Attending: Orthopaedic Surgery | Admitting: Orthopaedic Surgery

## 2016-08-05 ENCOUNTER — Encounter (HOSPITAL_COMMUNITY)
Admission: RE | Disposition: A | Discharge status: Home / Self Care | Payer: Self-pay | Source: Ambulatory Visit | Attending: Orthopaedic Surgery

## 2016-08-05 DIAGNOSIS — F410 Panic disorder [episodic paroxysmal anxiety] without agoraphobia: Secondary | ICD-10-CM | POA: Diagnosis not present

## 2016-08-05 DIAGNOSIS — M1611 Unilateral primary osteoarthritis, right hip: Secondary | ICD-10-CM

## 2016-08-05 DIAGNOSIS — Z79899 Other long term (current) drug therapy: Secondary | ICD-10-CM | POA: Insufficient documentation

## 2016-08-05 DIAGNOSIS — Z419 Encounter for procedure for purposes other than remedying health state, unspecified: Secondary | ICD-10-CM

## 2016-08-05 SURGERY — ARTHROPLASTY, HIP, TOTAL, ANTERIOR APPROACH
Anesthesia: General | Laterality: Right

## 2016-08-05 MED ORDER — CEFAZOLIN SODIUM-DEXTROSE 2-4 GM/100ML-% IV SOLN
2.0000 g | INTRAVENOUS | Status: AC
  Administered 2016-08-05: 2 g via INTRAVENOUS
  Filled 2016-08-05: qty 100

## 2016-08-05 MED ORDER — SCOPOLAMINE 1 MG/3DAYS TD PT72SCOPOLAMINE 1 MG/3DAYS
MEDICATED_PATCH | TRANSDERMAL | Status: DC | PRN
Start: 2016-08-05 — End: 2016-08-05
  Administered 2016-08-05: 1 via TRANSDERMAL

## 2016-08-05 MED ORDER — DEXAMETHASONE SODIUM PHOSPHATE 10 MG/ML IJ SOLN
INTRAMUSCULAR | Status: AC
  Filled 2016-08-05: qty 1

## 2016-08-05 MED ORDER — PROMETHAZINE HCL 25 MG/ML IJ SOLN: 6.2500 mg | INTRAMUSCULAR | Status: DC | PRN

## 2016-08-05 MED ORDER — PROPOFOL 10 MG/ML IV BOLUS
INTRAVENOUS | Status: DC | PRN
  Administered 2016-08-05: 130 mg via INTRAVENOUS

## 2016-08-05 MED ORDER — TRANEXAMIC ACID 1000 MG/10ML IV SOLN
1000.0000 mg | INTRAVENOUS | Status: DC
  Filled 2016-08-05: qty 10

## 2016-08-05 MED ORDER — CEFAZOLIN SODIUM-DEXTROSE 2-4 GM/100ML-% IV SOLN
INTRAVENOUS | Status: AC
  Filled 2016-08-05: qty 100

## 2016-08-05 MED ORDER — ONDANSETRON HCL 4 MG/2ML IJ SOLN
INTRAMUSCULAR | Status: AC
  Filled 2016-08-05: qty 2

## 2016-08-05 MED ORDER — HYDROMORPHONE HCL 1 MG/ML IJ SOLN: 0.2500 mg | INTRAMUSCULAR | Status: DC | PRN

## 2016-08-05 MED ORDER — FENTANYL CITRATE (PF) 100 MCG/2ML IJ SOLN
INTRAMUSCULAR | Status: AC
  Filled 2016-08-05: qty 2

## 2016-08-05 MED ORDER — SUCCINYLCHOLINE CHLORIDE 200 MG/10ML IV SOSY
PREFILLED_SYRINGE | INTRAVENOUS | Status: DC | PRN
  Administered 2016-08-05: 100 mg via INTRAVENOUS

## 2016-08-05 MED ORDER — FENTANYL CITRATE (PF) 100 MCG/2ML IJ SOLN
INTRAMUSCULAR | Status: DC | PRN
  Administered 2016-08-05: 50 ug via INTRAVENOUS

## 2016-08-05 MED ORDER — CHLORHEXIDINE GLUCONATE 4 % EX LIQD: 60.0000 mL | Freq: Once | CUTANEOUS | Status: DC

## 2016-08-05 MED ORDER — PROPOFOL 10 MG/ML IV BOLUS
INTRAVENOUS | Status: AC
  Filled 2016-08-05: qty 40

## 2016-08-05 MED ORDER — SCOPOLAMINE 1 MG/3DAYS TD PT72
MEDICATED_PATCH | TRANSDERMAL | Status: AC
  Filled 2016-08-05: qty 1

## 2016-08-05 MED ORDER — LACTATED RINGERS IV SOLN
INTRAVENOUS | Status: DC
  Administered 2016-08-05: 07:00:00 via INTRAVENOUS

## 2016-08-05 MED ORDER — ONDANSETRON HCL 4 MG/2ML IJ SOLN
INTRAMUSCULAR | Status: DC | PRN
  Administered 2016-08-05: 4 mg via INTRAVENOUS

## 2016-08-05 MED ORDER — MIDAZOLAM HCL 2 MG/2ML IJ SOLN
INTRAMUSCULAR | Status: AC
  Filled 2016-08-05: qty 2

## 2016-08-05 SURGICAL SUPPLY — 34 items
BAG ZIPLOCK 12X15 (MISCELLANEOUS) IMPLANT
BENZOIN TINCTURE PRP APPL 2/3 (GAUZE/BANDAGES/DRESSINGS) IMPLANT
BLADE SAW SGTL 18X1.27X75 (BLADE) ×2 IMPLANT
BLADE SAW SGTL 18X1.27X75MM (BLADE) ×1
CELLS DAT CNTRL 66122 CELL SVR (MISCELLANEOUS) ×1 IMPLANT
CLOSURE WOUND 1/2 X4 (GAUZE/BANDAGES/DRESSINGS)
CLOTH BEACON ORANGE TIMEOUT ST (SAFETY) ×3 IMPLANT
DRAPE STERI IOBAN 125X83 (DRAPES) ×3 IMPLANT
DRAPE U-SHAPE 47X51 STRL (DRAPES) ×6 IMPLANT
DRSG AQUACEL AG ADV 3.5X10 (GAUZE/BANDAGES/DRESSINGS) ×3 IMPLANT
DURAPREP 26ML APPLICATOR (WOUND CARE) ×3 IMPLANT
ELECT REM PT RETURN 9FT ADLT (ELECTROSURGICAL) ×3
ELECTRODE REM PT RTRN 9FT ADLT (ELECTROSURGICAL) ×1 IMPLANT
GAUZE XEROFORM 1X8 LF (GAUZE/BANDAGES/DRESSINGS) IMPLANT
GLOVE BIO SURGEON STRL SZ7.5 (GLOVE) ×3 IMPLANT
GLOVE BIOGEL PI IND STRL 8 (GLOVE) ×2 IMPLANT
GLOVE BIOGEL PI INDICATOR 8 (GLOVE) ×4
GLOVE ECLIPSE 8.0 STRL XLNG CF (GLOVE) ×3 IMPLANT
GOWN STRL REUS W/TWL XL LVL3 (GOWN DISPOSABLE) ×6 IMPLANT
HANDPIECE INTERPULSE COAX TIP (DISPOSABLE) ×2
HOLDER FOLEY CATH W/STRAP (MISCELLANEOUS) ×3 IMPLANT
PACK ANTERIOR HIP CUSTOM (KITS) ×3 IMPLANT
RTRCTR WOUND ALEXIS 18CM MED (MISCELLANEOUS) ×3
SET HNDPC FAN SPRY TIP SCT (DISPOSABLE) ×1 IMPLANT
STAPLER VISISTAT 35W (STAPLE) IMPLANT
STRIP CLOSURE SKIN 1/2X4 (GAUZE/BANDAGES/DRESSINGS) IMPLANT
SUT ETHIBOND NAB CT1 #1 30IN (SUTURE) ×3 IMPLANT
SUT MNCRL AB 4-0 PS2 18 (SUTURE) IMPLANT
SUT VIC AB 0 CT1 36 (SUTURE) ×3 IMPLANT
SUT VIC AB 1 CT1 36 (SUTURE) ×3 IMPLANT
SUT VIC AB 2-0 CT1 27 (SUTURE) ×4
SUT VIC AB 2-0 CT1 TAPERPNT 27 (SUTURE) ×2 IMPLANT
TRAY FOLEY W/METER SILVER 16FR (SET/KITS/TRAYS/PACK) ×3 IMPLANT
YANKAUER SUCT BULB TIP 10FT TU (MISCELLANEOUS) ×3 IMPLANT

## 2016-08-05 NOTE — Progress Notes (Signed)
Please add SURGICAL ORDERS FOR 08/10/16-  Since she was admitted today, the pre op orders have dropped off   thanks

## 2016-08-05 NOTE — H&P (Signed)
TOTAL HIP ADMISSION H&P  Patient is admitted for right total hip arthroplasty.  Subjective:  Chief Complaint: right hip pain  HPI: Cynthia [name], 55 y.o. female, has a history of pain and functional disability in the right hip(s) due to arthritis and patient has failed non-surgical conservative treatments for greater than 12 weeks to include NSAID's and/or analgesics, corticosteriod injections and activity modification.  Onset of symptoms was abrupt starting 1 years ago with gradually worsening course since that time.The patient noted no past surgery on the right hip(s).  Patient currently rates pain in the right hip at 10 out of 10 with activity. Patient has night pain, worsening of pain with activity and weight bearing, pain that interfers with activities of daily living and pain with passive range of motion. Patient has evidence of subchondral cysts, subchondral sclerosis, periarticular osteophytes and joint space narrowing by imaging studies. This condition presents safety issues increasing the risk of falls.  There is no current active infection.  Patient Active Problem List   Diagnosis Date Noted  . Unilateral primary osteoarthritis, right hip 08/05/2016  . Arthritis of right hip 07/15/2016  . Chronic right-sided low back pain without sciatica 07/15/2016  . Abnormal Pap smear of cervix 06/15/2015  . Allergic rhinitis 06/15/2015  . Panic attack 06/15/2015  . Left shoulder pain 05/25/2015  . Agoraphobia with panic attacks 02/04/2013  . FOOT PAIN, BILATERAL 02/18/2010   Past Medical History:  Diagnosis Date  . Anxiety   . Arthritis   . Depression    situation depression   . History of urinary tract infection    1998  . Multiple allergies   . Panic attacks    secondary to IV's   . Plantar fasciitis     Past Surgical History:  Procedure Laterality Date  . APPENDECTOMY  1974  . CERVICAL CONE BIOPSY    . REFRACTIVE SURGERY  1989  . WISDOM TOOTH EXTRACTION  1985     Prescriptions Prior to Admission  Medication Sig Dispense Refill Last Dose  . ALPRAZolam (XANAX) 0.5 MG tablet Take 1 tablet (0.5 mg total) by mouth 2 (two) times daily as needed for anxiety. 5 tablet 0 08/05/2016 at 0500  . BLACK COHOSH PO Take 540 mg by mouth daily.    08/04/2016 at Unknown time  . gabapentin (NEURONTIN) 100 MG capsule Take 100 mg by mouth daily as needed (pain).    Past Month at Unknown time  . Multiple Vitamins-Minerals (MULTIVITAMIN PO) Take 1 tablet by mouth daily.   08/04/2016 at Unknown time  . cyclobenzaprine (FLEXERIL) 10 MG tablet Take 1 tablet (10 mg total) by mouth 3 (three) times daily as needed for muscle spasms. (Patient not taking: Reported on 07/25/2016) 30 tablet 1 Not Taking at Unknown time  . methylPREDNISolone (MEDROL DOSEPAK) 4 MG TBPK tablet Take as directed (Patient not taking: Reported on 07/25/2016) 21 tablet 0 Not Taking at Unknown time   No Known Allergies  Social History  Substance Use Topics  . Smoking status: Never Smoker  . Smokeless tobacco: Never Used  . Alcohol use No    Family History  Problem Relation Age of Onset  . Diabetes Father 4170  . Hypertension Mother      Review of Systems  Musculoskeletal: Positive for back pain and joint pain.  All other systems reviewed and are negative.   Objective:  Physical Exam  Constitutional: Cynthia [name] is oriented to person, place, and time. Cynthia [name] appears well-developed and well-nourished.  HENT:  Head: Normocephalic  and atraumatic.  Eyes: EOM are normal. Pupils are equal, round, and reactive to light.  Neck: Normal range of motion. Neck supple.  Cardiovascular: Normal rate and regular rhythm.   Respiratory: Effort normal and breath sounds normal.  GI: Soft. Bowel sounds are normal.  Musculoskeletal:       Right hip: Cynthia [name] exhibits decreased range of motion, decreased strength, tenderness and bony tenderness.  Neurological: Cynthia [name] is alert and oriented to person, place, and time.  Skin: Skin is warm  and dry.  Psychiatric: Cynthia [name] has a normal mood and affect.    Vital signs in last 24 hours: Temp:  [97.2 F (36.2 C)] 97.2 F (36.2 C) (12/08 0533) Pulse Rate:  [93] 93 (12/08 0533) Resp:  [18] 18 (12/08 0533) BP: (129)/(86) 129/86 (12/08 0544) SpO2:  [100 %] 100 % (12/08 0533) Weight:  [145 lb (65.8 kg)] 145 lb (65.8 kg) (12/08 0533)  Labs:   Estimated body mass index is 25.28 kg/m as calculated from the following:   Height as of this encounter: 5' 3.5" (1.613 m).   Weight as of this encounter: 145 lb (65.8 kg).   Imaging Review Plain radiographs demonstrate moderate degenerative joint disease of the right hip(s). The bone quality appears to be excellent for age and reported activity level.  Assessment/Plan:  End stage arthritis, right hip(s)  The patient history, physical examination, clinical judgement of the provider and imaging studies are consistent with end stage degenerative joint disease of the right hip(s) and total hip arthroplasty is deemed medically necessary. The treatment options including medical management, injection therapy, arthroscopy and arthroplasty were discussed at length. The risks and benefits of total hip arthroplasty were presented and reviewed. The risks due to aseptic loosening, infection, stiffness, dislocation/subluxation,  thromboembolic complications and other imponderables were discussed.  The patient acknowledged the explanation, agreed to proceed with the plan and consent was signed. Patient is being admitted for inpatient treatment for surgery, pain control, PT, OT, prophylactic antibiotics, VTE prophylaxis, progressive ambulation and ADL's and discharge planning.The patient is planning to be discharged home with home health services

## 2016-08-05 NOTE — Anesthesia Preprocedure Evaluation (Signed)
Anesthesia Evaluation  Patient identified by MRN, date of birth, ID band Patient awake    Reviewed: Allergy & Precautions, NPO status , Patient's Chart, lab work & pertinent test results  History of Anesthesia Complications Negative for: history of anesthetic complications  Airway Mallampati: II  TM Distance: >3 FB Neck ROM: Full    Dental  (+) Dental Advisory Given   Pulmonary neg pulmonary ROS,    Pulmonary exam normal        Cardiovascular negative cardio ROS Normal cardiovascular exam     Neuro/Psych PSYCHIATRIC DISORDERS Anxiety Depression negative neurological ROS     GI/Hepatic negative GI ROS, Neg liver ROS,   Endo/Other  negative endocrine ROS  Renal/GU negative Renal ROS  negative genitourinary   Musculoskeletal   Abdominal   Peds negative pediatric ROS (+)  Hematology negative hematology ROS (+)   Anesthesia Other Findings   Reproductive/Obstetrics negative OB ROS                             Anesthesia Physical Anesthesia Plan  ASA: II  Anesthesia Plan: General   Post-op Pain Management:    Induction: Intravenous  Airway Management Planned: Oral ETT  Additional Equipment:   Intra-op Plan:   Post-operative Plan: Extubation in OR  Informed Consent: I have reviewed the patients History and Physical, chart, labs and discussed the procedure including the risks, benefits and alternatives for the proposed anesthesia with the patient or authorized representative who has indicated his/her understanding and acceptance.   Dental advisory given  Plan Discussed with: CRNA and Anesthesiologist  Anesthesia Plan Comments:         Anesthesia Quick Evaluation

## 2016-08-05 NOTE — Discharge Instructions (Signed)

## 2016-08-05 NOTE — Anesthesia Procedure Notes (Signed)
Procedure Name: Intubation Date/Time: 08/05/2016 7:34 AM Performed by: Wynonia SoursWALKER, Ethelean Colla L Pre-anesthesia Checklist: Patient identified, Emergency Drugs available, Suction available, Patient being monitored and Timeout performed Patient Re-evaluated:Patient Re-evaluated prior to inductionOxygen Delivery Method: Circle system utilized Preoxygenation: Pre-oxygenation with 100% oxygen Intubation Type: Combination inhalational/ intravenous induction, Cricoid Pressure applied and Rapid sequence Ventilation: Mask ventilation without difficulty Laryngoscope Size: Mac and 4 Grade View: Grade II Tube type: Oral Tube size: 7.0 mm Number of attempts: 1 Airway Equipment and Method: Stylet Placement Confirmation: ETT inserted through vocal cords under direct vision,  positive ETCO2,  CO2 detector and breath sounds checked- equal and bilateral Secured at: 21 cm Tube secured with: Tape Dental Injury: Teeth and Oropharynx as per pre-operative assessment  Comments: Patient has phobia of IV's. Decision made to do inhalation induction/intravenous induction. IV obtained while mask ventilating. Mask ventilation easy. DL x 1 attempt.

## 2016-08-05 NOTE — Anesthesia Postprocedure Evaluation (Signed)
Anesthesia Post Note  Patient: Marjory SneddonBrenda Mcglinn  Procedure(s) Performed: Procedure(s) (LRB): RIGHT TOTAL HIP ARTHROPLASTY ANTERIOR APPROACH (Right) CANCELLED PROCEDURE  Patient location during evaluation: PACU Anesthesia Type: General Level of consciousness: sedated Pain management: pain level controlled Vital Signs Assessment: post-procedure vital signs reviewed and stable Respiratory status: spontaneous breathing and respiratory function stable Cardiovascular status: stable Anesthetic complications: no    Last Vitals:  Vitals:   08/05/16 0845 08/05/16 0900  BP: (!) 142/82 118/90  Pulse: 92 92  Resp: 12 15  Temp: 36.2 C 36.2 C    Last Pain:  Vitals:   08/05/16 0909  TempSrc:   PainSc: 0-No pain                 Vandy Tsuchiya DANIEL

## 2016-08-05 NOTE — Transfer of Care (Signed)
Immediate Anesthesia Transfer of Care Note  Patient: Cynthia SneddonBrenda [name]  Procedure(s) Performed: Procedure(s): RIGHT TOTAL HIP ARTHROPLASTY ANTERIOR APPROACH (Right) CANCELLED PROCEDURE  Patient Location: PACU  Anesthesia Type:General  Level of Consciousness:  sedated, patient cooperative and responds to stimulation  Airway & Oxygen Therapy:Patient Spontanous Breathing and Patient connected to face mask oxgen  Post-op Assessment:  Report given to PACU RN and Post -op Vital signs reviewed and stable  Post vital signs:  Reviewed and stable  Last Vitals:  Vitals:   08/05/16 0533 08/05/16 0544  BP:  129/86  Pulse: 93   Resp: 18   Temp: 36.2 C     Complications: No apparent anesthesia complications

## 2016-08-08 ENCOUNTER — Other Ambulatory Visit (INDEPENDENT_AMBULATORY_CARE_PROVIDER_SITE_OTHER): Payer: Self-pay | Admitting: Orthopaedic Surgery

## 2016-08-08 LAB — TYPE AND SCREEN
ABO/RH(D): A POS
ANTIBODY SCREEN: NEGATIVE

## 2016-08-09 ENCOUNTER — Other Ambulatory Visit (INDEPENDENT_AMBULATORY_CARE_PROVIDER_SITE_OTHER): Payer: Self-pay | Admitting: Physician Assistant

## 2016-08-10 ENCOUNTER — Inpatient Hospital Stay (HOSPITAL_COMMUNITY): Payer: 59 | Admitting: Certified Registered Nurse Anesthetist

## 2016-08-10 ENCOUNTER — Encounter (HOSPITAL_COMMUNITY): Payer: Self-pay | Admitting: General Practice

## 2016-08-10 ENCOUNTER — Encounter (HOSPITAL_COMMUNITY)
Admission: RE | Disposition: A | Discharge status: Home / Self Care | Payer: Self-pay | Source: Ambulatory Visit | Attending: Orthopaedic Surgery

## 2016-08-10 ENCOUNTER — Inpatient Hospital Stay (HOSPITAL_COMMUNITY): Payer: 59

## 2016-08-10 ENCOUNTER — Inpatient Hospital Stay (HOSPITAL_COMMUNITY)
Admission: RE | Admit: 2016-08-10 | Discharge: 2016-08-11 | DRG: 470 | Disposition: A | Discharge status: Home / Self Care | Payer: 59 | Source: Ambulatory Visit | Attending: Orthopaedic Surgery | Admitting: Orthopaedic Surgery

## 2016-08-10 DIAGNOSIS — M1990 Unspecified osteoarthritis, unspecified site: Secondary | ICD-10-CM | POA: Diagnosis present

## 2016-08-10 DIAGNOSIS — M1611 Unilateral primary osteoarthritis, right hip: Secondary | ICD-10-CM | POA: Diagnosis not present

## 2016-08-10 DIAGNOSIS — Z9181 History of falling: Secondary | ICD-10-CM | POA: Diagnosis not present

## 2016-08-10 DIAGNOSIS — Z8249 Family history of ischemic heart disease and other diseases of the circulatory system: Secondary | ICD-10-CM

## 2016-08-10 DIAGNOSIS — Z833 Family history of diabetes mellitus: Secondary | ICD-10-CM

## 2016-08-10 DIAGNOSIS — Z419 Encounter for procedure for purposes other than remedying health state, unspecified: Secondary | ICD-10-CM

## 2016-08-10 DIAGNOSIS — Z79899 Other long term (current) drug therapy: Secondary | ICD-10-CM

## 2016-08-10 DIAGNOSIS — Z9049 Acquired absence of other specified parts of digestive tract: Secondary | ICD-10-CM | POA: Diagnosis not present

## 2016-08-10 DIAGNOSIS — Z96641 Presence of right artificial hip joint: Secondary | ICD-10-CM

## 2016-08-10 DIAGNOSIS — Z9889 Other specified postprocedural states: Secondary | ICD-10-CM | POA: Diagnosis not present

## 2016-08-10 HISTORY — PX: TOTAL HIP ARTHROPLASTY: SHX124

## 2016-08-10 LAB — TYPE AND SCREEN
ABO/RH(D): A POS
ANTIBODY SCREEN: NEGATIVE

## 2016-08-10 SURGERY — ARTHROPLASTY, HIP, TOTAL, ANTERIOR APPROACH
Anesthesia: General | Site: Hip | Laterality: Right

## 2016-08-10 MED ORDER — ONDANSETRON HCL 4 MG/2ML IJ SOLN
INTRAMUSCULAR | Status: DC | PRN
  Administered 2016-08-10: 4 mg via INTRAVENOUS

## 2016-08-10 MED ORDER — PROMETHAZINE HCL 25 MG/ML IJ SOLN: 6.2500 mg | INTRAMUSCULAR | Status: DC | PRN

## 2016-08-10 MED ORDER — CHLORHEXIDINE GLUCONATE 4 % EX LIQD: 60.0000 mL | Freq: Once | CUTANEOUS | Status: DC

## 2016-08-10 MED ORDER — PROPOFOL 10 MG/ML IV BOLUS
INTRAVENOUS | Status: DC | PRN
  Administered 2016-08-10: 110 mg via INTRAVENOUS

## 2016-08-10 MED ORDER — CEFAZOLIN SODIUM-DEXTROSE 2-4 GM/100ML-% IV SOLN
INTRAVENOUS | Status: AC
  Filled 2016-08-10: qty 100

## 2016-08-10 MED ORDER — FENTANYL CITRATE (PF) 100 MCG/2ML IJ SOLN
INTRAMUSCULAR | Status: AC
  Filled 2016-08-10: qty 2

## 2016-08-10 MED ORDER — DEXAMETHASONE SODIUM PHOSPHATE 10 MG/ML IJ SOLN
INTRAMUSCULAR | Status: DC | PRN
  Administered 2016-08-10: 10 mg via INTRAVENOUS

## 2016-08-10 MED ORDER — ASPIRIN 81 MG PO CHEW
81.0000 mg | CHEWABLE_TABLET | Freq: Two times a day (BID) | ORAL | Status: DC
Start: 2016-08-10 — End: 2016-08-11
  Administered 2016-08-10 – 2016-08-11 (×2): 81 mg via ORAL
  Filled 2016-08-10 (×2): qty 1

## 2016-08-10 MED ORDER — HYDROMORPHONE HCL 2 MG/ML IJ SOLN
INTRAMUSCULAR | Status: AC
  Administered 2016-08-10: 0.5 mg via INTRAVENOUS
  Filled 2016-08-10: qty 1

## 2016-08-10 MED ORDER — LACTATED RINGERS IV SOLN: INTRAVENOUS | Status: DC

## 2016-08-10 MED ORDER — ACETAMINOPHEN 325 MG PO TABS: 650.0000 mg | ORAL_TABLET | Freq: Four times a day (QID) | ORAL | Status: DC | PRN

## 2016-08-10 MED ORDER — TRANEXAMIC ACID 1000 MG/10ML IV SOLN
1000.0000 mg | INTRAVENOUS | Status: AC
  Administered 2016-08-10: 1000 mg via INTRAVENOUS
  Filled 2016-08-10: qty 1100

## 2016-08-10 MED ORDER — ALUM & MAG HYDROXIDE-SIMETH 200-200-20 MG/5ML PO SUSP: 30.0000 mL | ORAL | Status: DC | PRN

## 2016-08-10 MED ORDER — MENTHOL 3 MG MT LOZG: 1.0000 | LOZENGE | OROMUCOSAL | Status: DC | PRN

## 2016-08-10 MED ORDER — ONDANSETRON HCL 4 MG PO TABS
4.0000 mg | ORAL_TABLET | Freq: Four times a day (QID) | ORAL | Status: DC | PRN
  Administered 2016-08-10: 4 mg via ORAL
  Filled 2016-08-10: qty 1

## 2016-08-10 MED ORDER — ALPRAZOLAM 0.5 MG PO TABS: 0.5000 mg | ORAL_TABLET | Freq: Two times a day (BID) | ORAL | Status: DC | PRN

## 2016-08-10 MED ORDER — ROCURONIUM BROMIDE 50 MG/5ML IV SOSY
PREFILLED_SYRINGE | INTRAVENOUS | Status: DC | PRN
  Administered 2016-08-10: 40 mg via INTRAVENOUS

## 2016-08-10 MED ORDER — ACETAMINOPHEN 500 MG PO TABS
1000.0000 mg | ORAL_TABLET | ORAL | Status: AC
  Administered 2016-08-10: 1000 mg via ORAL
  Filled 2016-08-10: qty 2

## 2016-08-10 MED ORDER — MIDAZOLAM HCL 2 MG/ML PO SYRP
12.0000 mg | ORAL_SOLUTION | Freq: Once | ORAL | Status: DC
  Filled 2016-08-10: qty 6

## 2016-08-10 MED ORDER — PHENOL 1.4 % MT LIQD
1.0000 | OROMUCOSAL | Status: DC | PRN
  Filled 2016-08-10: qty 177

## 2016-08-10 MED ORDER — SODIUM CHLORIDE 0.9 % IR SOLN
Status: DC | PRN
  Administered 2016-08-10: 1000 mL

## 2016-08-10 MED ORDER — ROCURONIUM BROMIDE 50 MG/5ML IV SOSY
PREFILLED_SYRINGE | INTRAVENOUS | Status: AC
  Filled 2016-08-10: qty 5

## 2016-08-10 MED ORDER — METHOCARBAMOL 1000 MG/10ML IJ SOLN
500.0000 mg | Freq: Four times a day (QID) | INTRAVENOUS | Status: DC | PRN
  Administered 2016-08-10: 500 mg via INTRAVENOUS
  Filled 2016-08-10: qty 5
  Filled 2016-08-10: qty 550

## 2016-08-10 MED ORDER — SUGAMMADEX SODIUM 200 MG/2ML IV SOLN
INTRAVENOUS | Status: AC
  Filled 2016-08-10: qty 2

## 2016-08-10 MED ORDER — CEFAZOLIN IN D5W 1 GM/50ML IV SOLN
1.0000 g | Freq: Four times a day (QID) | INTRAVENOUS | Status: AC
  Administered 2016-08-10 – 2016-08-11 (×2): 1 g via INTRAVENOUS
  Filled 2016-08-10 (×2): qty 50

## 2016-08-10 MED ORDER — OXYCODONE HCL 5 MG PO TABS
5.0000 mg | ORAL_TABLET | ORAL | Status: DC | PRN
  Administered 2016-08-10 – 2016-08-11 (×4): 5 mg via ORAL
  Filled 2016-08-10 (×3): qty 2
  Filled 2016-08-10 (×2): qty 1

## 2016-08-10 MED ORDER — SUGAMMADEX SODIUM 200 MG/2ML IV SOLN
INTRAVENOUS | Status: DC | PRN
  Administered 2016-08-10: 150 mg via INTRAVENOUS

## 2016-08-10 MED ORDER — KETOROLAC TROMETHAMINE 15 MG/ML IJ SOLN
7.5000 mg | Freq: Four times a day (QID) | INTRAMUSCULAR | Status: DC
  Administered 2016-08-10 – 2016-08-11 (×3): 7.5 mg via INTRAVENOUS
  Filled 2016-08-10 (×3): qty 1

## 2016-08-10 MED ORDER — ONDANSETRON HCL 4 MG/2ML IJ SOLN
INTRAMUSCULAR | Status: AC
  Filled 2016-08-10: qty 2

## 2016-08-10 MED ORDER — MIDAZOLAM HCL 2 MG/2ML IJ SOLN
INTRAMUSCULAR | Status: AC
  Filled 2016-08-10: qty 2

## 2016-08-10 MED ORDER — HYDROMORPHONE HCL 2 MG/ML IJ SOLN
0.2500 mg | INTRAMUSCULAR | Status: DC | PRN
  Administered 2016-08-10 (×2): 0.5 mg via INTRAVENOUS

## 2016-08-10 MED ORDER — SODIUM CHLORIDE 0.9 % IV SOLN
INTRAVENOUS | Status: DC
  Administered 2016-08-10: 18:00:00 via INTRAVENOUS

## 2016-08-10 MED ORDER — ONDANSETRON HCL 4 MG/2ML IJ SOLN: 4.0000 mg | Freq: Four times a day (QID) | INTRAMUSCULAR | Status: DC | PRN

## 2016-08-10 MED ORDER — ZOLPIDEM TARTRATE 5 MG PO TABS: 5.0000 mg | ORAL_TABLET | Freq: Every evening | ORAL | Status: DC | PRN

## 2016-08-10 MED ORDER — LACTATED RINGERS IV SOLN
INTRAVENOUS | Status: DC
  Administered 2016-08-10 (×2): via INTRAVENOUS

## 2016-08-10 MED ORDER — DEXAMETHASONE SODIUM PHOSPHATE 10 MG/ML IJ SOLN
INTRAMUSCULAR | Status: AC
  Filled 2016-08-10: qty 1

## 2016-08-10 MED ORDER — DIPHENHYDRAMINE HCL 12.5 MG/5ML PO ELIX: 12.5000 mg | ORAL_SOLUTION | ORAL | Status: DC | PRN

## 2016-08-10 MED ORDER — LIP MEDEX EX OINT
TOPICAL_OINTMENT | CUTANEOUS | Status: AC
  Filled 2016-08-10: qty 7

## 2016-08-10 MED ORDER — DOCUSATE SODIUM 100 MG PO CAPS
100.0000 mg | ORAL_CAPSULE | Freq: Two times a day (BID) | ORAL | Status: DC
Start: 2016-08-10 — End: 2016-08-11
  Administered 2016-08-11: 09:00:00 100 mg via ORAL
  Filled 2016-08-10 (×2): qty 1

## 2016-08-10 MED ORDER — METHOCARBAMOL 500 MG PO TABS: 500.0000 mg | ORAL_TABLET | Freq: Four times a day (QID) | ORAL | Status: DC | PRN

## 2016-08-10 MED ORDER — 0.9 % SODIUM CHLORIDE (POUR BTL) OPTIME
TOPICAL | Status: DC | PRN
  Administered 2016-08-10: 1000 mL

## 2016-08-10 MED ORDER — METOCLOPRAMIDE HCL 5 MG/ML IJ SOLN: 5.0000 mg | Freq: Three times a day (TID) | INTRAMUSCULAR | Status: DC | PRN

## 2016-08-10 MED ORDER — ACETAMINOPHEN 650 MG RE SUPP: 650.0000 mg | Freq: Four times a day (QID) | RECTAL | Status: DC | PRN

## 2016-08-10 MED ORDER — LIDOCAINE 2% (20 MG/ML) 5 ML SYRINGE
INTRAMUSCULAR | Status: AC
  Filled 2016-08-10: qty 5

## 2016-08-10 MED ORDER — PROPOFOL 10 MG/ML IV BOLUS
INTRAVENOUS | Status: AC
  Filled 2016-08-10: qty 20

## 2016-08-10 MED ORDER — METOCLOPRAMIDE HCL 5 MG PO TABS: 5.0000 mg | ORAL_TABLET | Freq: Three times a day (TID) | ORAL | Status: DC | PRN

## 2016-08-10 MED ORDER — STERILE WATER FOR IRRIGATION IR SOLN
Status: DC | PRN
  Administered 2016-08-10: 3000 mL

## 2016-08-10 MED ORDER — CEFAZOLIN SODIUM-DEXTROSE 2-4 GM/100ML-% IV SOLN
2.0000 g | INTRAVENOUS | Status: AC
  Administered 2016-08-10: 2 g via INTRAVENOUS

## 2016-08-10 MED ORDER — FENTANYL CITRATE (PF) 100 MCG/2ML IJ SOLN
INTRAMUSCULAR | Status: DC | PRN
  Administered 2016-08-10 (×4): 50 ug via INTRAVENOUS

## 2016-08-10 SURGICAL SUPPLY — 38 items
BAG ZIPLOCK 12X15 (MISCELLANEOUS) IMPLANT
BENZOIN TINCTURE PRP APPL 2/3 (GAUZE/BANDAGES/DRESSINGS) ×3 IMPLANT
BLADE SAW SGTL 18X1.27X75 (BLADE) ×2 IMPLANT
BLADE SAW SGTL 18X1.27X75MM (BLADE) ×1
CAPT HIP TOTAL 2 ×3 IMPLANT
CELLS DAT CNTRL 66122 CELL SVR (MISCELLANEOUS) ×1 IMPLANT
CLOSURE WOUND 1/2 X4 (GAUZE/BANDAGES/DRESSINGS) ×1
CLOTH BEACON ORANGE TIMEOUT ST (SAFETY) ×3 IMPLANT
DRAPE STERI IOBAN 125X83 (DRAPES) ×3 IMPLANT
DRAPE U-SHAPE 47X51 STRL (DRAPES) ×6 IMPLANT
DRSG AQUACEL AG ADV 3.5X10 (GAUZE/BANDAGES/DRESSINGS) ×3 IMPLANT
DURAPREP 26ML APPLICATOR (WOUND CARE) ×3 IMPLANT
ELECT REM PT RETURN 9FT ADLT (ELECTROSURGICAL) ×3
ELECTRODE REM PT RTRN 9FT ADLT (ELECTROSURGICAL) ×1 IMPLANT
GAUZE XEROFORM 1X8 LF (GAUZE/BANDAGES/DRESSINGS) IMPLANT
GLOVE BIO SURGEON STRL SZ7.5 (GLOVE) ×3 IMPLANT
GLOVE BIOGEL PI IND STRL 6.5 (GLOVE) ×4 IMPLANT
GLOVE BIOGEL PI IND STRL 8 (GLOVE) ×2 IMPLANT
GLOVE BIOGEL PI INDICATOR 6.5 (GLOVE) ×8
GLOVE BIOGEL PI INDICATOR 8 (GLOVE) ×4
GLOVE ECLIPSE 8.0 STRL XLNG CF (GLOVE) ×3 IMPLANT
GOWN STRL REUS W/TWL XL LVL3 (GOWN DISPOSABLE) ×12 IMPLANT
HANDPIECE INTERPULSE COAX TIP (DISPOSABLE) ×2
HOLDER FOLEY CATH W/STRAP (MISCELLANEOUS) ×3 IMPLANT
PACK ANTERIOR HIP CUSTOM (KITS) ×3 IMPLANT
RTRCTR WOUND ALEXIS 18CM MED (MISCELLANEOUS) ×3
SET HNDPC FAN SPRY TIP SCT (DISPOSABLE) ×1 IMPLANT
STAPLER VISISTAT 35W (STAPLE) IMPLANT
STRIP CLOSURE SKIN 1/2X4 (GAUZE/BANDAGES/DRESSINGS) ×2 IMPLANT
SUT ETHIBOND NAB CT1 #1 30IN (SUTURE) ×3 IMPLANT
SUT MNCRL AB 4-0 PS2 18 (SUTURE) ×3 IMPLANT
SUT VIC AB 0 CT1 36 (SUTURE) ×3 IMPLANT
SUT VIC AB 1 CT1 36 (SUTURE) ×3 IMPLANT
SUT VIC AB 2-0 CT1 27 (SUTURE) ×4
SUT VIC AB 2-0 CT1 TAPERPNT 27 (SUTURE) ×2 IMPLANT
TRAY CATH 16FR W/PLASTIC CATH (SET/KITS/TRAYS/PACK) ×3 IMPLANT
TRAY FOLEY W/METER SILVER 16FR (SET/KITS/TRAYS/PACK) ×3 IMPLANT
YANKAUER SUCT BULB TIP 10FT TU (MISCELLANEOUS) ×3 IMPLANT

## 2016-08-10 NOTE — H&P (Signed)
TOTAL HIP ADMISSION H&P  Patient is admitted for right total hip arthroplasty.  Subjective:  Chief Complaint: right hip pain  HPI: Cynthia [name], 55 y.o. female, has a history of pain and functional disability in the right hip(s) due to arthritis and patient has failed non-surgical conservative treatments for greater than 12 weeks to include NSAID's and/or analgesics, corticosteriod injections, flexibility and strengthening excercises and activity modification.  Onset of symptoms was abrupt starting 1 years ago with gradually worsening course since that time.The patient noted no past surgery on the right hip(s).  Patient currently rates pain in the right hip at 10 out of 10 with activity. Patient has night pain, worsening of pain with activity and weight bearing, pain that interfers with activities of daily living and pain with passive range of motion. Patient has evidence of subchondral cysts, subchondral sclerosis, periarticular osteophytes and joint space narrowing by imaging studies. This condition presents safety issues increasing the risk of falls.  There is no current active infection.  Patient Active Problem List   Diagnosis Date Noted  . Unilateral primary osteoarthritis, right hip 08/05/2016  . Arthritis of right hip 07/15/2016  . Chronic right-sided low back pain without sciatica 07/15/2016  . Abnormal Pap smear of cervix 06/15/2015  . Allergic rhinitis 06/15/2015  . Panic attack 06/15/2015  . Left shoulder pain 05/25/2015  . Agoraphobia with panic attacks 02/04/2013  . FOOT PAIN, BILATERAL 02/18/2010   Past Medical History:  Diagnosis Date  . Anxiety   . Arthritis   . Depression    situation depression   . History of urinary tract infection    1998  . Multiple allergies   . Panic attacks    secondary to IV's   . Plantar fasciitis     Past Surgical History:  Procedure Laterality Date  . APPENDECTOMY  1974  . CERVICAL CONE BIOPSY    . REFRACTIVE SURGERY  1989  .  WISDOM TOOTH EXTRACTION  1985    Prescriptions Prior to Admission  Medication Sig Dispense Refill Last Dose  . ALPRAZolam (XANAX) 0.5 MG tablet Take 1 tablet (0.5 mg total) by mouth 2 (two) times daily as needed for anxiety. 5 tablet 0 08/05/2016 at 0500  . BLACK COHOSH PO Take 540 mg by mouth daily.    08/04/2016 at Unknown time  . cyclobenzaprine (FLEXERIL) 10 MG tablet Take 1 tablet (10 mg total) by mouth 3 (three) times daily as needed for muscle spasms. (Patient not taking: Reported on 07/25/2016) 30 tablet 1 Not Taking at Unknown time  . gabapentin (NEURONTIN) 100 MG capsule Take 100 mg by mouth daily as needed (pain).    Past Month at Unknown time  . methylPREDNISolone (MEDROL DOSEPAK) 4 MG TBPK tablet Take as directed (Patient not taking: Reported on 07/25/2016) 21 tablet 0 Not Taking at Unknown time  . Multiple Vitamins-Minerals (MULTIVITAMIN PO) Take 1 tablet by mouth daily.   08/04/2016 at Unknown time   No Known Allergies  Social History  Substance Use Topics  . Smoking status: Never Smoker  . Smokeless tobacco: Never Used  . Alcohol use No    Family History  Problem Relation Age of Onset  . Diabetes Father 2170  . Hypertension Mother      Review of Systems  Musculoskeletal: Positive for back pain and joint pain.  All other systems reviewed and are negative.   Objective:  Physical Exam  Constitutional: She is oriented to person, place, and time. She appears well-developed and well-nourished.  HENT:  Head: Normocephalic and atraumatic.  Eyes: EOM are normal. Pupils are equal, round, and reactive to light.  Neck: Normal range of motion. Neck supple.  Cardiovascular: Normal rate and regular rhythm.   Respiratory: Effort normal and breath sounds normal.  GI: Soft. Bowel sounds are normal.  Musculoskeletal:       Right hip: She exhibits decreased range of motion, decreased strength, tenderness and bony tenderness.  Neurological: She is alert and oriented to person, place,  and time.  Skin: Skin is warm and dry.  Psychiatric: She has a normal mood and affect.    Vital signs in last 24 hours:    Labs:   Estimated body mass index is 25.28 kg/m as calculated from the following:   Height as of 08/05/16: 5' 3.5" (1.613 m).   Weight as of 08/05/16: 145 lb (65.8 kg).   Imaging Review Plain radiographs demonstrate moderate degenerative joint disease of the right hip(s). The bone quality appears to be excellent for age and reported activity level.  Assessment/Plan:  End stage arthritis, right hip(s)  The patient history, physical examination, clinical judgement of the provider and imaging studies are consistent with end stage degenerative joint disease of the right hip(s) and total hip arthroplasty is deemed medically necessary. The treatment options including medical management, injection therapy, arthroscopy and arthroplasty were discussed at length. The risks and benefits of total hip arthroplasty were presented and reviewed. The risks due to aseptic loosening, infection, stiffness, dislocation/subluxation,  thromboembolic complications and other imponderables were discussed.  The patient acknowledged the explanation, agreed to proceed with the plan and consent was signed. Patient is being admitted for inpatient treatment for surgery, pain control, PT, OT, prophylactic antibiotics, VTE prophylaxis, progressive ambulation and ADL's and discharge planning.The patient is planning to be discharged home with home health services

## 2016-08-10 NOTE — Progress Notes (Signed)
Pt reports severe agoraphobia and panic attacks related to IVs. Refuses any attempt at IV. Dr Jacklynn BueMassagee at bedside aware and will gas then IV in OR.

## 2016-08-10 NOTE — Anesthesia Procedure Notes (Signed)
Procedure Name: Intubation Date/Time: 08/10/2016 1:47 PM Performed by: Wynonia SoursWALKER, Bernie Fobes L Pre-anesthesia Checklist: Patient identified, Timeout performed, Emergency Drugs available, Suction available and Patient being monitored Patient Re-evaluated:Patient Re-evaluated prior to inductionOxygen Delivery Method: Circle system utilized Preoxygenation: Pre-oxygenation with 100% oxygen Intubation Type: Combination inhalational/ intravenous induction and Cricoid Pressure applied Ventilation: Mask ventilation without difficulty Laryngoscope Size: Mac and 3 Grade View: Grade I Tube size: 7.0 mm Number of attempts: 1 Placement Confirmation: ETT inserted through vocal cords under direct vision,  positive ETCO2,  CO2 detector and breath sounds checked- equal and bilateral Secured at: 22 cm Tube secured with: Tape Dental Injury: Teeth and Oropharynx as per pre-operative assessment  Comments: Patient has panic attacks associated with IV placements. Inhalation induction chosen after discussion with anesthesia team. Patient agreeable. Patient easy mask ventilation. IV placement performed with adequate anesthesia. IV induction meds given and patient intubated and airway secured.

## 2016-08-10 NOTE — Transfer of Care (Signed)
Immediate Anesthesia Transfer of Care Note  Patient: Cynthia SneddonBrenda [name]  Procedure(s) Performed: Procedure(s): RIGHT TOTAL HIP ARTHROPLASTY ANTERIOR APPROACH (Right)  Patient Location: PACU  Anesthesia Type:General  Level of Consciousness:  sedated, patient cooperative and responds to stimulation  Airway & Oxygen Therapy:Patient Spontanous Breathing and Patient connected to face mask oxgen  Post-op Assessment:  Report given to PACU RN and Post -op Vital signs reviewed and stable  Post vital signs:  Reviewed and stable  Last Vitals:  Vitals:   08/10/16 1131  BP: (!) 157/93  Pulse: 84  Resp: 16  Temp: 36.3 C    Complications: No apparent anesthesia complications

## 2016-08-10 NOTE — Anesthesia Preprocedure Evaluation (Signed)
Anesthesia Evaluation  Patient identified by MRN, date of birth, ID band Patient awake    Reviewed: Allergy & Precautions, NPO status , Patient's Chart, lab work & pertinent test results  History of Anesthesia Complications Negative for: history of anesthetic complications  Airway Mallampati: II  TM Distance: >3 FB Neck ROM: Full    Dental  (+) Dental Advisory Given, Teeth Intact   Pulmonary neg pulmonary ROS,    Pulmonary exam normal breath sounds clear to auscultation       Cardiovascular negative cardio ROS Normal cardiovascular exam Rhythm:Regular Rate:Normal     Neuro/Psych PSYCHIATRIC DISORDERS Anxiety Depression negative neurological ROS     GI/Hepatic negative GI ROS, Neg liver ROS,   Endo/Other  negative endocrine ROS  Renal/GU negative Renal ROS  negative genitourinary   Musculoskeletal   Abdominal   Peds negative pediatric ROS (+)  Hematology negative hematology ROS (+)   Anesthesia Other Findings   Reproductive/Obstetrics negative OB ROS                             Anesthesia Physical  Anesthesia Plan  ASA: II  Anesthesia Plan: General   Post-op Pain Management:    Induction: Inhalational  Airway Management Planned: Oral ETT  Additional Equipment:   Intra-op Plan:   Post-operative Plan: Extubation in OR  Informed Consent: I have reviewed the patients History and Physical, chart, labs and discussed the procedure including the risks, benefits and alternatives for the proposed anesthesia with the patient or authorized representative who has indicated his/her understanding and acceptance.   Dental advisory given  Plan Discussed with: CRNA and Anesthesiologist  Anesthesia Plan Comments:         Anesthesia Quick Evaluation

## 2016-08-10 NOTE — Brief Op Note (Signed)
08/10/2016  2:49 PM  PATIENT:  Cynthia SneddonBrenda [name]  55 y.o. female  PRE-OPERATIVE DIAGNOSIS:  Right hip osteoarthritis  POST-OPERATIVE DIAGNOSIS:  Right hip osteoarthritis  PROCEDURE:  Procedure(s): RIGHT TOTAL HIP ARTHROPLASTY ANTERIOR APPROACH (Right)  SURGEON:  Surgeon(s) and Role:    * Kathryne Hitchhristopher Y Blackman, MD - Primary  PHYSICIAN ASSISTANT: Rexene EdisonGil Clark, PA-C  ANESTHESIA:   general  EBL:  Total I/O In: 1000 [I.V.:1000] Out: 300 [Blood:300]  COUNTS:  YES  DICTATION: .Other Dictation: Dictation Number [id number]  PLAN OF CARE: Admit to inpatient   PATIENT DISPOSITION:  PACU - hemodynamically stable.   Delay start of Pharmacological VTE agent (>24hrs) due to surgical blood loss or risk of bleeding: no

## 2016-08-10 NOTE — Anesthesia Postprocedure Evaluation (Signed)
Anesthesia Post Note  Patient: Cynthia SneddonBrenda [name]  Procedure(s) Performed: Procedure(s) (LRB): RIGHT TOTAL HIP ARTHROPLASTY ANTERIOR APPROACH (Right)  Patient location during evaluation: PACU Anesthesia Type: General Level of consciousness: awake and alert Pain management: pain level controlled Vital Signs Assessment: post-procedure vital signs reviewed and stable Respiratory status: spontaneous breathing, nonlabored ventilation, respiratory function stable and patient connected to nasal cannula oxygen Cardiovascular status: blood pressure returned to baseline and stable Postop Assessment: no signs of nausea or vomiting Anesthetic complications: no    Last Vitals:  Vitals:   08/10/16 1600 08/10/16 1625  BP: (!) 126/91 126/80  Pulse: 83 79  Resp: (!) 21 18  Temp: 36.4 C 36.4 C    Last Pain:  Vitals:   08/10/16 1600  TempSrc:   PainSc: 3                  Kennieth RadFitzgerald, Briannie Gutierrez E

## 2016-08-11 LAB — CBC
HCT: 32 % — ABNORMAL LOW (ref 36.0–46.0)
Hemoglobin: 11.1 g/dL — ABNORMAL LOW (ref 12.0–15.0)
MCH: 31.8 pg (ref 26.0–34.0)
MCHC: 34.7 g/dL (ref 30.0–36.0)
MCV: 91.7 fL (ref 78.0–100.0)
PLATELETS: 253 10*3/uL (ref 150–400)
RBC: 3.49 MIL/uL — ABNORMAL LOW (ref 3.87–5.11)
RDW: 12.3 % (ref 11.5–15.5)
WBC: 10.2 10*3/uL (ref 4.0–10.5)

## 2016-08-11 LAB — BASIC METABOLIC PANEL
ANION GAP: 6 (ref 5–15)
BUN: 16 mg/dL (ref 6–20)
CALCIUM: 8.6 mg/dL — AB (ref 8.9–10.3)
CO2: 26 mmol/L (ref 22–32)
Chloride: 103 mmol/L (ref 101–111)
Creatinine, Ser: 0.75 mg/dL (ref 0.44–1.00)
GFR calc Af Amer: >60 mL/min (ref >60)
GLUCOSE: 129 mg/dL — AB (ref 65–99)
Potassium: 3.8 mmol/L (ref 3.5–5.1)
Sodium: 135 mmol/L (ref 135–145)

## 2016-08-11 MED ORDER — ASPIRIN 81 MG PO CHEW: 81.0000 mg | CHEWABLE_TABLET | Freq: Two times a day (BID) | ORAL | 0 refills | Status: DC

## 2016-08-11 MED ORDER — OXYCODONE-ACETAMINOPHEN 5-325 MG PO TABS: 1.0000 | ORAL_TABLET | ORAL | 0 refills | Status: DC | PRN

## 2016-08-11 MED ORDER — ASPIRIN 81 MG PO CHEW
81.0000 mg | CHEWABLE_TABLET | Freq: Two times a day (BID) | ORAL | 0 refills | Status: DC
Start: 2016-08-11 — End: 2016-08-11

## 2016-08-11 MED ORDER — CYCLOBENZAPRINE HCL 10 MG PO TABS: 10.0000 mg | ORAL_TABLET | Freq: Three times a day (TID) | ORAL | 1 refills | Status: DC | PRN

## 2016-08-11 NOTE — Progress Notes (Signed)
Subjective: 1 Day Post-Op Procedure(s) (LRB): RIGHT TOTAL HIP ARTHROPLASTY ANTERIOR APPROACH (Right) Patient reports pain as moderate.  No Complaints. Objective: Vital signs in last 24 hours: Temp:  [97.4 F (36.3 C)-99.1 F (37.3 C)] 98.7 F (37.1 C) (12/14 0505) Pulse Rate:  [77-93] 85 (12/14 0505) Resp:  [15-21] 16 (12/14 0505) BP: (113-157)/(74-93) 121/83 (12/14 0505) SpO2:  [99 %-100 %] 99 % (12/14 0505) Weight:  [149 lb 12.8 oz (67.9 kg)] 149 lb 12.8 oz (67.9 kg) (12/13 1131)  Intake/Output from previous day: 12/13 0701 - 12/14 0700 In: 2971.3 [P.O.:600; I.V.:2316.3; IV Piggyback:55] Out: 1750 [Urine:1450; Blood:300] Intake/Output this shift: Total I/O In: 240 [P.O.:240] Out: -    Recent Labs  08/11/16 0414  HGB 11.1*    Recent Labs  08/11/16 0414  WBC 10.2  RBC 3.49*  HCT 32.0*  PLT 253    Recent Labs  08/11/16 0414  NA 135  K 3.8  CL 103  CO2 26  BUN 16  CREATININE 0.75  GLUCOSE 129*  CALCIUM 8.6*   No results for input(s): LABPT, INR in the last 72 hours.  Neurovascular intact Incision: dressing C/D/I Compartment soft  Assessment/Plan: 1 Day Post-Op Procedure(s) (LRB): RIGHT TOTAL HIP ARTHROPLASTY ANTERIOR APPROACH (Right) Up with therapy  Possible discharge to home this afternoon if does well with PT and remains stable  Estel Tonelli 08/11/2016, 9:00 AM

## 2016-08-11 NOTE — Evaluation (Signed)
Physical Therapy Evaluation Patient Details Name: Cynthia SneddonBrenda [name] MRN: [id number] DOB: 1961/08/15 Today's Date: 08/11/2016   History of Present Illness  Pt s/p R THR  Clinical Impression  Pt s/p R THR presents with decreased R LE strength/ROM and post op pain limiting functional mobility.  Pt is currently mobilizing at MOD I level and is licensed PT - plans to complete rehab at home with assist of spouse.    Follow Up Recommendations No PT follow up    Equipment Recommendations  None recommended by PT    Recommendations for Other Services       Precautions / Restrictions Precautions Precautions: Fall Restrictions Weight Bearing Restrictions: No Other Position/Activity Restrictions: WBAT      Mobility  Bed Mobility Overal bed mobility: Modified Independent             General bed mobility comments: Pt self assisting R LE with L LE  Transfers Overall transfer level: Modified independent Equipment used: None Transfers: Sit to/from Stand Sit to Stand: Modified independent (Device/Increase time)            Ambulation/Gait Ambulation/Gait assistance: Modified independent (Device/Increase time) Ambulation Distance (Feet): 400 Feet Assistive device: Rolling walker (2 wheeled);Crutches Gait Pattern/deviations: Step-through pattern;Shuffle;Trunk flexed Gait velocity: decr Gait velocity interpretation: Below normal speed for age/gender General Gait Details: 200' each with RW and crutches  Stairs Stairs: Yes Stairs assistance: Min guard Stair Management: No rails;Step to pattern;Forwards;With crutches Number of Stairs: 4    Wheelchair Mobility    Modified Rankin (Stroke Patients Only)       Balance Overall balance assessment: No apparent balance deficits (not formally assessed)                                           Pertinent Vitals/Pain Pain Assessment: 0-10 Pain Score: 2  Pain Location: R hip/thigh Pain Descriptors /  Indicators: Aching;Sore Pain Intervention(s): Limited activity within patient's tolerance;Monitored during session;Premedicated before session;Ice applied    Home Living Family/patient expects to be discharged to:: Private residence Living Arrangements: Spouse/significant other Available Help at Discharge: Family Type of Home: House Home Access: Stairs to enter Entrance Stairs-Rails: None Secretary/administratorntrance Stairs-Number of Steps: 2 Home Layout: One level Home Equipment: Crutches      Prior Function Level of Independence: Independent         Comments: Works as PT     Higher education careers adviserHand Dominance        Extremity/Trunk Assessment   Upper Extremity Assessment Upper Extremity Assessment: Overall WFL for tasks assessed    Lower Extremity Assessment Lower Extremity Assessment: RLE deficits/detail RLE Deficits / Details: Strength at hip 3-/5 with AAROM at hip to 90 flex and 20 abd    Cervical / Trunk Assessment Cervical / Trunk Assessment: Normal  Communication   Communication: No difficulties  Cognition Arousal/Alertness: Awake/alert Behavior During Therapy: WFL for tasks assessed/performed Overall Cognitive Status: Within Functional Limits for tasks assessed                      General Comments      Exercises Total Joint Exercises Ankle Circles/Pumps: AROM;Both;20 reps;Supine Gluteal Sets: AROM;Both;10 reps;Supine Heel Slides: AAROM;Right;20 reps;Supine Hip ABduction/ADduction: AAROM;Right;15 reps;Supine Long Arc Quad: AROM;Right;10 reps;Seated   Assessment/Plan    PT Assessment Patent does not need any further PT services  PT Problem List Decreased strength;Decreased range of motion;Decreased activity tolerance;Decreased mobility;Decreased  knowledge of use of DME;Pain          PT Treatment Interventions DME instruction;Gait training;Stair training;Functional mobility training;Therapeutic activities;Therapeutic exercise;Patient/family education    PT Goals (Current  goals can be found in the Care Plan section)  Acute Rehab PT Goals Patient Stated Goal: Return home and complete rehab with assist of spouse PT Goal Formulation: All assessment and education complete, DC therapy Time For Goal Achievement: 08/11/16    Frequency Min 1X/week   Barriers to discharge        Co-evaluation               End of Session Equipment Utilized During Treatment: Gait belt Activity Tolerance: Patient tolerated treatment well Patient left: Other (comment) (sitting EOB) Nurse Communication: Mobility status         Time: 1005-1037 PT Time Calculation (min) (ACUTE ONLY): 32 min   Charges:   PT Evaluation $PT Eval Low Complexity: 1 Procedure PT Treatments $Gait Training: 8-22 mins   PT G Codes:        Cynthia [name] 08/11/2016, 12:07 PM

## 2016-08-11 NOTE — Care Management Note (Signed)
Case Management Note  Patient Details  Name: Cynthia [name] MRN: [id number] Date of Birth: 07/29/1961  Subjective/Objective:                  s/p R THR Action/Plan: Discharge planning Expected Discharge Date:                  Expected Discharge Plan:  Home/Self Care  In-House Referral:     Discharge planning Services  CM Consult  Post Acute Care Choice:  NA Choice offered to:  Patient  DME Arranged:  N/A DME Agency:     HH Arranged:  NA HH Agency:  NA  Status of Service:  Completed, signed off  If discussed at [place] of Stay Meetings, dates discussed:    Additional Comments: Cm met with pt in room to confirm plan is for no [facility] services; pt confirms.  Pt states she has crutches and does not need HHPT (none recc) and does not need additional DME. No other CM needs were communicated. Dellie Catholic, RN 08/11/2016, 12:58 PM

## 2016-08-11 NOTE — Progress Notes (Signed)
OT Cancellation Note  Patient Details Name: Cynthia [name] MRN: [id number] DOB: 08-26-1961   Cancelled Treatment:    Reason Eval/Treat Not Completed: Other (comment)  Spoke with pt regarding role of OT  Pt does not feel OT needed.  Lise AuerLori Jacob [place], [place] [phone] Einar CrowEDDING, Adaeze Better D 08/11/2016, 10:59 AM

## 2016-08-11 NOTE — Discharge Summary (Signed)
Patient ID: Cynthia SneddonBrenda Martinec MRN: [id number] DOB/AGE: Dec 16, 1960 55 y.o.  Admit date: 08/10/2016 Discharge date: 08/11/2016  Admission Diagnoses:  Principal Problem:   Unilateral primary osteoarthritis, right hip Active Problems:   Status post total replacement of right hip   Discharge Diagnoses:  Same  Past Medical History:  Diagnosis Date  . Anxiety   . Arthritis   . Depression    situation depression   . History of urinary tract infection    1998  . Multiple allergies   . Panic attacks    secondary to IV's   . Plantar fasciitis     Surgeries: Procedure(s): RIGHT TOTAL HIP ARTHROPLASTY ANTERIOR APPROACH on 08/10/2016   Consultants:   Discharged Condition: Improved  Hospital Course: Cynthia SneddonBrenda Agan is an 55 y.o. female who was admitted 08/10/2016 for operative treatment ofUnilateral primary osteoarthritis, right hip. Patient has severe unremitting pain that affects sleep, daily activities, and work/hobbies. After pre-op clearance the patient was taken to the operating room on 08/10/2016 and underwent  Procedure(s): RIGHT TOTAL HIP ARTHROPLASTY ANTERIOR APPROACH.    Patient was given perioperative antibiotics: Anti-infectives    Start     Dose/Rate Route Frequency Ordered Stop   08/10/16 2000  ceFAZolin (ANCEF) IVPB 1 g/50 mL premix     1 g 100 mL/hr over 30 Minutes Intravenous Every 6 hours 08/10/16 1629 08/11/16 0232   08/10/16 1108  ceFAZolin (ANCEF) IVPB 2g/100 mL premix     2 g 200 mL/hr over 30 Minutes Intravenous On call to O.R. 08/10/16 1108 08/10/16 1420       Patient was given sequential compression devices, early ambulation, and chemoprophylaxis to prevent DVT.  Patient benefited maximally from hospital stay and there were no complications.    Recent vital signs: Patient Vitals for the past 24 hrs:  BP Temp Temp src Pulse Resp SpO2 Height Weight  08/11/16 0505 121/83 98.7 F (37.1 C) Oral 85 16 99 % - -  08/11/16 0201 116/75 98.6 F (37 C) Oral 77  16 100 % - -  08/10/16 2129 121/74 98.7 F (37.1 C) Oral 88 16 99 % - -  08/10/16 1934 130/86 99.1 F (37.3 C) Oral 80 16 99 % - -  08/10/16 1834 124/78 98.3 F (36.8 C) Oral 82 15 100 % - -  08/10/16 1734 125/77 98.3 F (36.8 C) Oral 82 16 100 % - -  08/10/16 1625 126/80 97.6 F (36.4 C) - 79 18 100 % - -  08/10/16 1600 (!) 126/91 97.6 F (36.4 C) - 83 (!) 21 100 % - -  08/10/16 1545 120/86 - - 83 16 100 % - -  08/10/16 1530 113/76 - - 92 17 100 % - -  08/10/16 1523 119/86 98.2 F (36.8 C) - 93 17 100 % - -  08/10/16 1131 (!) 157/93 97.4 F (36.3 C) Oral 84 16 100 % 5' 3.5" (1.613 m) 149 lb 12.8 oz (67.9 kg)     Recent laboratory studies:  Recent Labs  08/11/16 0414  WBC 10.2  HGB 11.1*  HCT 32.0*  PLT 253  NA 135  K 3.8  CL 103  CO2 26  BUN 16  CREATININE 0.75  GLUCOSE 129*  CALCIUM 8.6*     Discharge Medications:     Medication List    STOP taking these medications   methylPREDNISolone 4 MG Tbpk tablet Commonly known as:  MEDROL DOSEPAK     TAKE these medications   ALPRAZolam 0.5 MG tablet Commonly  known as:  XANAX Take 1 tablet (0.5 mg total) by mouth 2 (two) times daily as needed for anxiety.   aspirin 81 MG chewable tablet Chew 1 tablet (81 mg total) by mouth 2 (two) times daily.   BLACK COHOSH PO Take 540 mg by mouth daily.   cyclobenzaprine 10 MG tablet Commonly known as:  FLEXERIL Take 1 tablet (10 mg total) by mouth 3 (three) times daily as needed for muscle spasms.   gabapentin 100 MG capsule Commonly known as:  NEURONTIN Take 100 mg by mouth daily as needed (pain).   MULTIVITAMIN PO Take 1 tablet by mouth daily.   oxyCODONE-acetaminophen 5-325 MG tablet Commonly known as:  ROXICET Take 1-2 tablets by mouth every 4 (four) hours as needed.            Durable Medical Equipment        Start     Ordered   08/10/16 1630  DME Walker rolling  Once    Question:  Patient needs a walker to treat with the following condition   Answer:  Status post total replacement of right hip   08/10/16 1629      Diagnostic Studies: Mr Lumbar Spine W/o Contrast  Result Date: 07/25/2016 CLINICAL DATA:  Chronic right-sided low back pain without sciatica. Right SI joint pain. Evaluation of L4 radiculopathy to the right hip. EXAM: MRI LUMBAR SPINE WITHOUT CONTRAST TECHNIQUE: Multiplanar, multisequence MR imaging of the lumbar spine was performed. No intravenous contrast was administered. COMPARISON:  None. FINDINGS: Segmentation: Normal lumbar segmentation is assumed, with the lowest fully formed disc space designated L5-S1. Alignment:  Trace retrolisthesis of L2 on L3. Vertebrae: Preserved vertebral body heights without evidence of fracture, osseous lesion, or significant marrow edema. Small T11-12 Schmorl's node. Minimal type 2 degenerative endplate changes at L2-3. Conus medullaris: Extends to the upper L2 level and appears normal. Paraspinal and other soft tissues: Unremarkable. Disc levels: Disc desiccation and mild disc space narrowing from L1-2 to L4-5. L1-2: Small right foraminal disc extrusion results in mild right neural foraminal stenosis without evidence of exiting L1 nerve compression. No spinal stenosis. L2-3:  Mild disc bulging asymmetric to the right without stenosis. L3-4: Mild disc bulging results in minimal bilateral neural foraminal stenosis and minimal left lateral recess stenosis without spinal stenosis. L4-5: Mild disc bulging results in mild right and minimal left neural foraminal stenosis without spinal stenosis. L5-S1:  Mild facet arthrosis without disc herniation or stenosis. IMPRESSION: Mild multilevel lumbar disc degeneration. Mild right neural foraminal stenosis at L1-2 and L4-5. Electronically Signed   By: Sebastian AcheAllen  Grady M.D.   On: 07/25/2016 09:47   Dg Pelvis Portable  Result Date: 08/10/2016 CLINICAL DATA:  Status post total hip arthroplasty EXAM: PORTABLE PELVIS 1-2 VIEWS COMPARISON:  None. FINDINGS: Frontal view  lower pelvis and hips obtained. There is a total hip replacement on the right with prosthetic components appearing well-seated on frontal view. No fracture or dislocation evident. There is mild narrowing of the left hip joint. Soft tissue air is noted on the right, an expected postoperative finding. IMPRESSION: Total hip prosthesis on the right with prosthetic components appearing well-seated on frontal view. No fracture or dislocation. Slight narrowing left hip joint. Electronically Signed   By: Bretta BangWilliam  Woodruff III M.D.   On: 08/10/2016 16:01   Dg C-arm 1-60 Min-no Report  Result Date: 08/10/2016 There is no Radiologist interpretation  for this exam.  Dg Hip Operative Unilat W Or W/o Pelvis Right  Result Date:  08/10/2016 CLINICAL DATA:  Right total hip arthroplasty anterior approach EXAM: OPERATIVE right HIP (WITH PELVIS IF PERFORMED) to VIEWS TECHNIQUE: Fluoroscopic spot image(s) were submitted for interpretation post-operatively. COMPARISON:  MRI pelvis 02/18/2016 FINDINGS: Right hip arthroplasty in satisfactory position alignment. No acute complication IMPRESSION: Satisfactory right hip arthroplasty. Electronically Signed   By: Marlan Palau M.D.   On: 08/10/2016 15:01   Xr Lumbar Spine 2-3 Views  Result Date: 07/15/2016 Findings: 2V x-ray lumbar spine:: Slight dextroscoliosis but otherwise well aligned. Curvature is < 10.  She has some joint space loss between L4-5 as well as L1-L2 & L3-4. There is a small osteophytic spur within the posterior aspect of the L4-5 interspace. Otherwise back is well aligned. No significant soft tissue findings.  No acute fracture/dislocation.  Impression: Degenerative spurring at L4-5 with slight change at L3/L4.   Disposition: 01-Home or Self Care    Follow-up Information    Kathryne Hitch, MD. Schedule an appointment as soon as possible for a visit in 2 week(s).   Specialty:  Orthopedic Surgery Contact information: [address] [place] [address] [phone]            Signed: Richardean Canal 08/11/2016, 9:06 AM

## 2016-08-11 NOTE — Op Note (Signed)
NAMBernarda Caffey:  Levene, Colandra              ACCOUNT NO.:  [id number]  MEDICAL RECORD NO.:  [id number]  LOCATION:  [facility]                         [facility]  PHYSICIAN:  Vanita PandaChristopher Y. Magnus IvanBlackman, M.D.DATE OF BIRTH:  [birth year]  DATE OF PROCEDURE:  08/10/2016 DATE OF DISCHARGE:                              OPERATIVE REPORT   PREOPERATIVE DIAGNOSIS:  Primary osteoarthritis and degenerative joint disease, right hip.  POSTOPERATIVE DIAGNOSIS:  Primary osteoarthritis and degenerative joint disease, right hip.  PROCEDURE:  Right total hip arthroplasty through direct anterior approach.  IMPLANTS:  DePuy Sector Gription acetabular component size 50, size 32+ 0 polyethylene liner, size 9 Corail femoral component with varus offset, size 32+ 1 ceramic hip ball.  SURGEON:  Vanita PandaChristopher Y. Magnus IvanBlackman, M.D..  ASSISTANT:  Richardean CanalGilbert Clark, PA-C.  ANESTHESIA:  General.  ANTIBIOTICS:  2 g of IV Ancef.  BLOOD LOSS:  300 mL.  COMPLICATIONS:  None.  INDICATIONS:  Ms. Honor JunesStarner is a very healthy 55 year old female with cartilage deficit in her right hip.  This has become debilitating and causing severe pain.  She has tried and failed all forms of conservative treatment including an intra-articular injection.  This did help the pain for a while, but now is definitely affected it detrimentally her activities of daily living, her quality of life, and her mobility.  At this point, she does wish to proceed with a total hip arthroplasty.  She understands the risk of acute blood loss anemia, nerve and vessel injury, fracture, infection, dislocation, and DVT.  She also understands our goals are to decreased pain, improved mobility, and overall improved quality of life.  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  PROCEDURE DESCRIPTION:  After informed consent was obtained, appropriate right hip was marked.  She was brought to the operating room.  Mask ventilation was obtained.  She had an IV placed and general anesthesia was performed.  She then had traction boots applied to her feet.  She was placed supine on the Hana fracture table, the perineal post in place and both legs in inline skeletal traction devices, but no traction applied.  Her right operative hip was then prepped and draped with DuraPrep and sterile drapes.  Time-out was called, and she was identify as correct patient and correct right hip.  We then made an incision just inferior and posterior to the anterior superior iliac spine and carried this obliquely down the leg.  We dissected down the tensor fascia lata muscle and the tensor fascia was then divided longitudinally to proceed with a direct anterior approach to the hip.  We identified and cauterized the circumflex vessels, and then identified the hip capsule. On opening up the hip capsule, finding a moderate joint effusion.  We placed Cobra retractors around the medial and lateral femoral neck and then made our femoral neck cut proximal to the lesser trochanter and completed this on osteotome.  I placed a corkscrew guide  in the femoral head and removed the femoral head in its entirety and found it here to have a cartilage defect in her articulating weightbearing surface.  We removed the femoral head in its entirety and then removed the remnants of the acetabular labrum.  I placed a bent Hohmann over the medial acetabular rim and then began reaming under direct visualization from a size 42 reamer up to a size 50.  All reamers again were placed under direct visualization.  The last reamer under direct fluoroscopy, so we could obtain our depth of reaming, our inclination, and anteversion. Once we were pleased with this, we placed  the real DePuy Sector Gription acetabular component size 50 and a 32+ 0 neutral polyethylene liner for that size acetabular component.  Attention was then turned to the femur. With the leg externally rotated to 120 degrees, extended and adducted, we were able to place a Mueller retractor medially and a Hohmann retractor behind the greater trochanter.  We released the lateral joint capsule and used a box cutting osteotome to enter the femoral canal and a rongeur to lateralize.  We then again began broaching using the Corail broaching system from a size 8 broach only up to a size 9 given her tight canal.  We trialed a standard offset femoral neck and a 32+ 1 hip ball.  We brought the leg back over and up with traction and rotation reducing the pelvis.  We were pleased with stability.  We felt that she was a little bit long.  We dislocated the hip and removed the trial components.  We knew I could not put a stem down any further due to the tightness of the canal.  We then went with a varus offset femoral neck. We were able to place the real size 9 Corail femoral component with varus offset femoral neck and the real 32+ 1 hip ball of the ceramic. We reduced this in the acetabulum.  We were pleased with stability, leg length, and offset.  We then irrigated the soft tissue in normal saline solution using pulsatile lavage.  We closed the joint capsule in interrupted #1 Ethibond suture followed by running #1 Vicryl to the tensor fascia, 0 Vicryl in the deep tissue, 2-0 Vicryl in the subcutaneous tissue, 4-0 Monocryl subcuticular stitch, and Steri-Strips on the skin.  An Aquacel dressing was applied.  She was then taken off the fracture table, awakened, extubated, and taken to the recovery room in stable condition.  All final counts were correct.  There were no complications noted.  Of note, Richardean CanalGilbert Clark, PA-C assisted in the entire case.  His assistance was crucial for facilitating all aspects  of this case.     Vanita Pandahristopher Y. Magnus IvanBlackman, M.D.     CYB/MEDQ  D:  08/10/2016  T:  08/11/2016  Job:  [id number]

## 2016-08-11 NOTE — Discharge Instructions (Signed)

## 2016-08-11 NOTE — Progress Notes (Deleted)
OT Cancellation Note  Patient Details Name: Cynthia SneddonBrenda [name] MRN: [id number] DOB: [birth year]   Cancelled Treatment:    Reason Eval/Treat Not Completed: OT screened, no needs identified, will sign off  Keslyn Teater, Metro KungLorraine D 08/11/2016, 10:56 AM

## 2016-08-18 ENCOUNTER — Encounter: Payer: Self-pay | Admitting: *Deleted

## 2016-08-18 ENCOUNTER — Inpatient Hospital Stay (INDEPENDENT_AMBULATORY_CARE_PROVIDER_SITE_OTHER): Payer: 59 | Admitting: Orthopaedic Surgery

## 2016-08-25 ENCOUNTER — Ambulatory Visit (INDEPENDENT_AMBULATORY_CARE_PROVIDER_SITE_OTHER): Payer: 59 | Admitting: Orthopaedic Surgery

## 2016-08-25 DIAGNOSIS — Z96641 Presence of right artificial hip joint: Secondary | ICD-10-CM

## 2016-08-25 NOTE — Progress Notes (Signed)
The patient is now just over 2 weeks status post a little right total hip replacement. She is ambulating with a crutch. She reports that she is doing well. She's been having some cramps at night but it is waking her up but otherwise she reports that she is doing well.  On examination her suture line looks good. The Steri-Strips are in place. She does have a mild to moderate seroma but feels that we do not need to aspirate this. Her leg lengths appear equal.  At this point she'll slowly increase her activities as comfort allows. She'll try alternating ice and heat around her hip incision. I'll see her back in 4 weeks see how she doing overall but no x-rays are needed.

## 2016-08-27 ENCOUNTER — Encounter (INDEPENDENT_AMBULATORY_CARE_PROVIDER_SITE_OTHER): Payer: Self-pay | Admitting: Orthopaedic Surgery

## 2016-08-27 ENCOUNTER — Encounter: Payer: Self-pay | Admitting: Family Medicine

## 2016-08-30 ENCOUNTER — Other Ambulatory Visit (HOSPITAL_COMMUNITY)
Admission: RE | Admit: 2016-08-30 | Discharge: 2016-08-30 | Disposition: A | Discharge status: Home / Self Care | Payer: 59 | Source: Ambulatory Visit | Attending: Family Medicine | Admitting: Family Medicine

## 2016-08-30 ENCOUNTER — Encounter: Payer: Self-pay | Admitting: Family Medicine

## 2016-08-30 ENCOUNTER — Ambulatory Visit (INDEPENDENT_AMBULATORY_CARE_PROVIDER_SITE_OTHER): Payer: 59 | Admitting: Family Medicine

## 2016-08-30 VITALS — BP 128/78 | HR 99 | Temp 97.9°F | Resp 16 | Ht 64.0 in | Wt 150.6 lb

## 2016-08-30 DIAGNOSIS — Z1151 Encounter for screening for human papillomavirus (HPV): Secondary | ICD-10-CM | POA: Insufficient documentation

## 2016-08-30 DIAGNOSIS — Z01419 Encounter for gynecological examination (general) (routine) without abnormal findings: Secondary | ICD-10-CM | POA: Insufficient documentation

## 2016-08-30 DIAGNOSIS — Z0000 Encounter for general adult medical examination without abnormal findings: Secondary | ICD-10-CM | POA: Diagnosis not present

## 2016-08-30 NOTE — Telephone Encounter (Signed)
I don't see agoraphobia written-- I only see panic attacks and anxiety--- Where does she see it?

## 2016-08-30 NOTE — Progress Notes (Signed)
Subjective:     Cynthia [name] is a 56 y.o. female and is here for a comprehensive physical exam. The patient reports no problems.--- had R hip replacement about 3 weeks ago.  Overall pt is doing well.     Social History   Social History  . Marital status: Married    Spouse name: N/A  . Number of children: N/A  . Years of education: N/A   Occupational History  .  [employer]    physical therapy   Social History Main Topics  . Smoking status: Never Smoker  . Smokeless tobacco: Never Used  . Alcohol use No  . Drug use: No  . Sexual activity: Yes    Partners: Male   Other Topics Concern  . Not on file   Social History Narrative   Exercise--gym 5-6 days a week   Health Maintenance  Topic Date Due  . Hepatitis C Screening  Jun 06, 1961  . HIV Screening  05/10/1978  . COLONOSCOPY  05/10/1981  . PAP SMEAR  04/21/2017  . MAMMOGRAM  08/01/2017  . TETANUS/TDAP  05/29/2022  . INFLUENZA VACCINE  Completed    The following portions of the patient's history were reviewed and updated as appropriate: She  has a past medical history of Anxiety; Arthritis; Depression; History of urinary tract infection; Multiple allergies; Panic attacks; and Plantar fasciitis. She  does not have any pertinent problems on file. She  has a past surgical history that includes Appendectomy (1974); Wisdom tooth extraction (1985); Refractive surgery (1989); Cervical cone biopsy; and Total hip arthroplasty (Right, 08/10/2016). Her family history includes Diabetes (age of onset: 101) in her father; Hypertension in her mother. She  reports that she has never smoked. She has never used smokeless tobacco. She reports that she does not drink alcohol or use drugs. She has a current medication list which includes the following prescription(s): aspirin, black cohosh, gabapentin, multiple vitamins-minerals, and oxycodone-acetaminophen. Current Outpatient Prescriptions on File Prior to Visit  Medication Sig  Dispense Refill  . aspirin 81 MG chewable tablet Chew 1 tablet (81 mg total) by mouth 2 (two) times daily. (Patient taking differently: Chew 81 mg by mouth daily. ) 36 tablet 0  . BLACK COHOSH PO Take 540 mg by mouth daily.     Marland Kitchen gabapentin (NEURONTIN) 100 MG capsule Take 100 mg by mouth daily as needed (pain).     . Multiple Vitamins-Minerals (MULTIVITAMIN PO) Take 1 tablet by mouth daily.    Marland Kitchen oxyCODONE-acetaminophen (ROXICET) 5-325 MG tablet Take 1-2 tablets by mouth every 4 (four) hours as needed. 90 tablet 0   No current facility-administered medications on file prior to visit.    She has No Known Allergies..  Review of Systems Review of Systems  Constitutional: Negative for activity change, appetite change and fatigue.  HENT: Negative for hearing loss, congestion, tinnitus and ear discharge.  dentist q75m Eyes: Negative for visual disturbance (see optho q1y -- vision corrected to 20/20 with glasses).  Respiratory: Negative for cough, chest tightness and shortness of breath.   Cardiovascular: Negative for chest pain, palpitations and leg swelling.  Gastrointestinal: Negative for abdominal pain, diarrhea, constipation and abdominal distention.  Genitourinary: Negative for urgency, frequency, decreased urine volume and difficulty urinating.  Musculoskeletal: Negative for back pain, arthralgias and gait problem.  Skin: Negative for color change, pallor and rash.  Neurological: Negative for dizziness, light-headedness, numbness and headaches.  Hematological: Negative for adenopathy. Does not bruise/bleed easily.  Psychiatric/Behavioral: Negative for suicidal ideas, confusion,  sleep disturbance, self-injury, dysphoric mood, decreased concentration and agitation.       Objective:    BP 128/78 (BP Location: Left Arm, Cuff Size: Normal)   Pulse 99   Temp 97.9 F (36.6 C) (Oral)   Resp 16   Ht 5\' 4"  (1.626 m)   Wt 150 lb 9.6 oz (68.3 kg)   SpO2 98%   BMI 25.85 kg/m  General  appearance: alert, cooperative, appears stated age and no distress Head: Normocephalic, without obvious abnormality, atraumatic Eyes: conjunctivae/corneas clear. PERRL, EOM's intact. Fundi benign. Ears: normal TM's and external ear canals both ears Nose: Nares normal. Septum midline. Mucosa normal. No drainage or sinus tenderness. Throat: lips, mucosa, and tongue normal; teeth and gums normal Neck: no adenopathy, no carotid bruit, no JVD, supple, symmetrical, trachea midline and thyroid not enlarged, symmetric, no tenderness/mass/nodules Back: symmetric, no curvature. ROM normal. No CVA tenderness. Lungs: clear to auscultation bilaterally Breasts: normal appearance, no masses or tenderness Heart: regular rate and rhythm, S1, S2 normal, no murmur, click, rub or gallop Abdomen: soft, non-tender; bowel sounds normal; no masses,  no organomegaly Pelvic: cervix normal in appearance, external genitalia normal, no adnexal masses or tenderness, no cervical motion tenderness, rectovaginal septum normal, uterus normal size, shape, and consistency and vagina normal without discharge Extremities: extremities normal, atraumatic, no cyanosis or edema Pulses: 2+ and symmetric Skin: Skin color, texture, turgor normal. No rashes or lesions Lymph nodes: Cervical, supraclavicular, and axillary nodes normal. Neurologic: Alert and oriented X 3, normal strength and tone. Normal symmetric reflexes. Normal coordination and gait    Assessment:    Healthy female exam.      Plan:    ghm utd Check labs See After Visit Summary for Counseling Recommendations    1. Preventative health care Pt was uncomfortable with pap---unsure if ec sample done accurately-- pt squeezed speculm out and would not let us try again Pt refused rectal and labs - Cytology - PAP

## 2016-08-30 NOTE — Progress Notes (Signed)
Pre visit review using our clinic review tool, if applicable. No additional management support is needed unless otherwise documented below in the visit note. 

## 2016-08-30 NOTE — Patient Instructions (Signed)

## 2016-09-01 ENCOUNTER — Telehealth: Payer: Self-pay | Admitting: Family Medicine

## 2016-09-01 LAB — CYTOLOGY - PAP
Diagnosis: NEGATIVE
HPV: NOT DETECTED

## 2016-09-01 NOTE — Telephone Encounter (Signed)
Relation to NW:GNFApt:self Call back number:[phone]   Reason for call:  Patient returning call regarding lab results

## 2016-09-01 NOTE — Telephone Encounter (Signed)
Patient informed of pap results/see result notes.

## 2016-09-22 ENCOUNTER — Ambulatory Visit (INDEPENDENT_AMBULATORY_CARE_PROVIDER_SITE_OTHER): Payer: 59 | Admitting: Orthopaedic Surgery

## 2016-09-22 DIAGNOSIS — Z96641 Presence of right artificial hip joint: Secondary | ICD-10-CM

## 2016-09-22 NOTE — Progress Notes (Signed)
The patient is following up 5 weeks status post a right total hip arthroplasty through direct injury approach. She is making progress. She is walking without assistive device. There is still stiffness but is there but overall she is doing well.  On examination her incision looks great. There is no evidence of the seroma. She definitely still stiff in her hip flexors. She can touch her toes was having problems flexing her hip in terms of putting her shoes and socks on easily.  At this point she'll continue increase her activities. I'll see her back in a month to see how she is doing overall but no x-rays are needed.

## 2016-10-10 ENCOUNTER — Other Ambulatory Visit (INDEPENDENT_AMBULATORY_CARE_PROVIDER_SITE_OTHER): Payer: Self-pay

## 2016-10-10 ENCOUNTER — Telehealth (INDEPENDENT_AMBULATORY_CARE_PROVIDER_SITE_OTHER): Payer: Self-pay | Admitting: *Deleted

## 2016-10-10 MED ORDER — AMOXICILLIN 500 MG PO TABS: ORAL_TABLET | ORAL | 0 refills | Status: DC

## 2016-10-10 NOTE — Telephone Encounter (Signed)
Patient aware she will need a pre-med 3 months after surgery

## 2016-10-10 NOTE — Telephone Encounter (Signed)
Dentist needs paper stating pt does not need pre med for any procedure with Dr. Eliberto IvoryBlackman's signature on it. Pt was supposed to have a cleaning that was not done because they did not have documentation.  FAX: [phone]

## 2016-10-20 ENCOUNTER — Ambulatory Visit (INDEPENDENT_AMBULATORY_CARE_PROVIDER_SITE_OTHER): Payer: 59 | Admitting: Orthopaedic Surgery

## 2016-10-20 DIAGNOSIS — Z96641 Presence of right artificial hip joint: Secondary | ICD-10-CM

## 2016-10-20 NOTE — Progress Notes (Signed)
The patient is now 10 weeks status post a right total hip arthroplasty through direct injury approach. She still has just a little bit hip stiffness but she is doing better she says with range of motion and doing things like cutting her toenails. She still reports some decreased sensation just posterior to her hip incision but overall feels like range of motion is improving significantly.  On examination she demonstrates excellent range of motion of her right hip with internal rotation rotation as was cross her legs easily. Her incision looks great. There is also some slight swelling. There is subjective numbness posterior to the incision.  This point she'll continue increase her activities. I have no restrictions for her. I would like to see her back in 6 months I'll like just a low AP pelvis at that visit.

## 2016-12-05 ENCOUNTER — Encounter: Payer: Self-pay | Admitting: *Deleted

## 2016-12-14 ENCOUNTER — Ambulatory Visit (INDEPENDENT_AMBULATORY_CARE_PROVIDER_SITE_OTHER): Payer: 59 | Admitting: *Deleted

## 2016-12-14 DIAGNOSIS — I83811 Varicose veins of right lower extremities with pain: Secondary | ICD-10-CM

## 2016-12-14 NOTE — Progress Notes (Addendum)
X=.3% Sotradecol administered with a 27g butterfly.  Patient received a total of 5cc.  Treated veins that have been painful. Not the easiest of access but successful after several tries. Pt was sensitive to the sticks and jumped a little. Hoping for good results. Will follow prn.    Compression stockings applied: Yes.

## 2016-12-21 ENCOUNTER — Encounter: Payer: Self-pay | Admitting: *Deleted

## 2016-12-28 ENCOUNTER — Ambulatory Visit: Payer: 59 | Admitting: *Deleted

## 2017-04-05 IMAGING — RF DG HIP (WITH PELVIS) OPERATIVE*R*
1 series · 2 of 2 positions shown · non-contrast
Comparison: MRI pelvis 02/18/2016

CLINICAL DATA: Right total hip arthroplasty anterior approach

EXAM:
OPERATIVE right HIP (WITH PELVIS IF PERFORMED) to VIEWS
TECHNIQUE: Fluoroscopic spot image(s) were submitted for interpretation
post-operatively.

[Series 1: run · 2 of 2 slices shown]
[im 1/2]
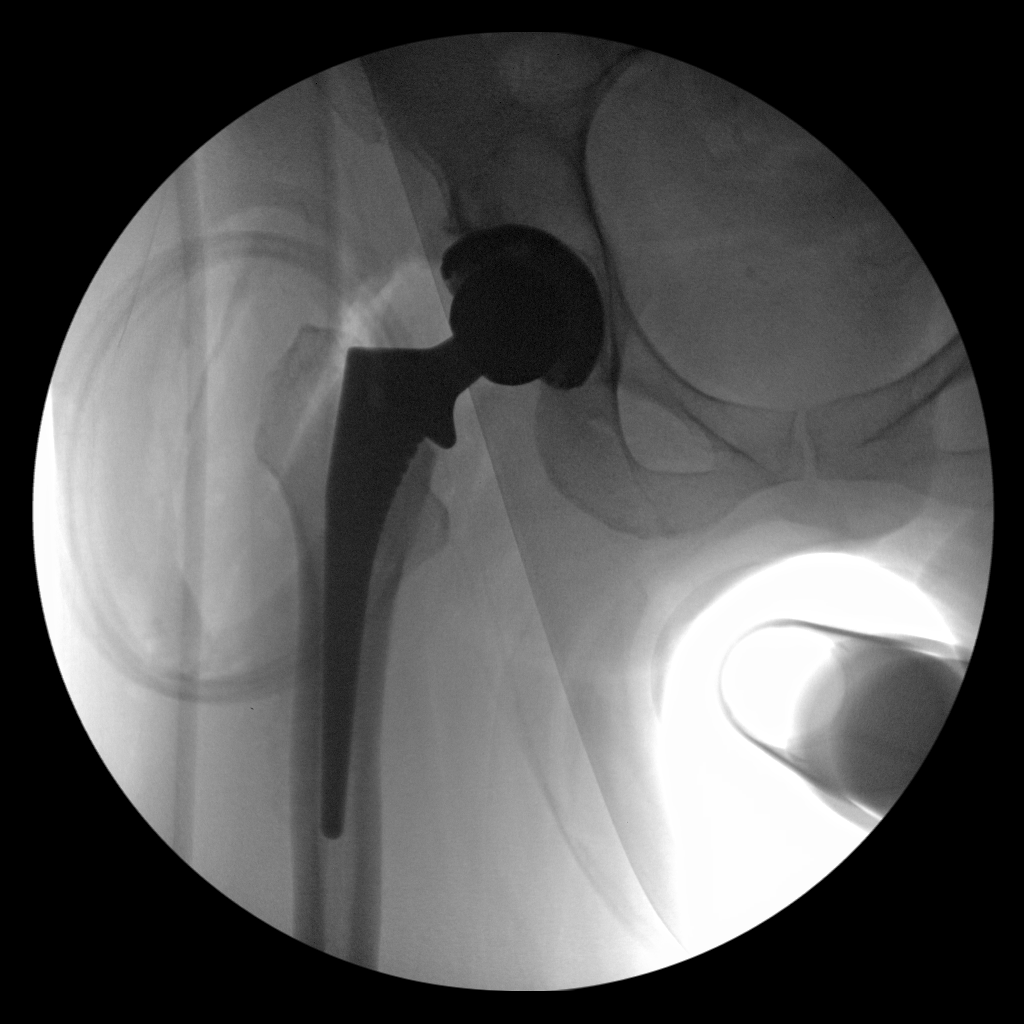
[im 2/2]
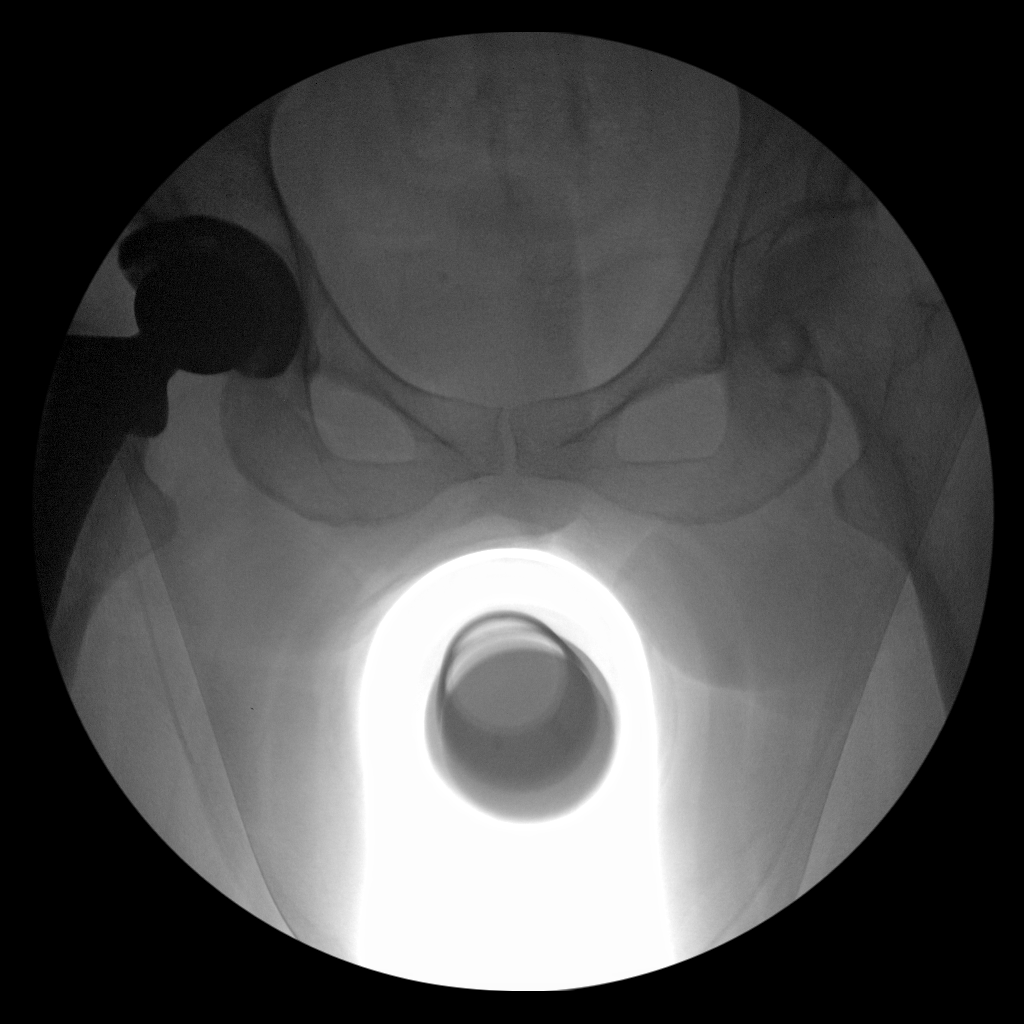

[2 of 2 positions shown; findings below may reference images not displayed]

FINDINGS: Right hip arthroplasty in satisfactory position alignment. No acute
complication
IMPRESSION: Satisfactory right hip arthroplasty.

## 2017-04-19 ENCOUNTER — Ambulatory Visit (INDEPENDENT_AMBULATORY_CARE_PROVIDER_SITE_OTHER): Payer: 59 | Admitting: Orthopaedic Surgery

## 2017-04-19 ENCOUNTER — Ambulatory Visit (INDEPENDENT_AMBULATORY_CARE_PROVIDER_SITE_OTHER): Payer: 59

## 2017-04-19 DIAGNOSIS — Z96641 Presence of right artificial hip joint: Secondary | ICD-10-CM

## 2017-04-19 NOTE — Progress Notes (Signed)
The patient is now 9 months status post a right total hip arthroplasty through direct anterior approach. She says she is doing well overall and her bigger complaints are IT band pain.  On exam her right and left hips move smoothly. Her incisions well-healed. She does have patellofemoral crepitation and IT band irritation on the right knee.  X-rays of her right hip and pelvis show well-seated implant with no, getting features.  We talked about shoewear and other exercises for her knees. As far as a hip standpoint, she will follow-up as needed we talked her at length in detail about things need to bring her back for that hip. She understands we can see her for anything else as well. All questions were encouraged and answered.

## 2017-08-30 ENCOUNTER — Ambulatory Visit (INDEPENDENT_AMBULATORY_CARE_PROVIDER_SITE_OTHER): Payer: Self-pay | Admitting: *Deleted

## 2017-08-30 DIAGNOSIS — I781 Nevus, non-neoplastic: Secondary | ICD-10-CM

## 2017-08-30 NOTE — Progress Notes (Signed)
   Cutaneous Laser:pulsed mode  810j/cm2 400 ms delay  13 ms Duration 0.5 spot  Total pulses: 152 Total energy 241  Total time::01   Compression stockings applied: No. NA  Pt had an excellent result from her previous tx. Only a few tiny vessels remain. Tx them with the cutaneous laser. Good local rxn. Follow prn.

## 2017-08-31 ENCOUNTER — Encounter: Payer: Self-pay | Admitting: Vascular Surgery

## 2017-10-11 DIAGNOSIS — Z0000 Encounter for general adult medical examination without abnormal findings: Secondary | ICD-10-CM | POA: Diagnosis not present

## 2017-10-11 DIAGNOSIS — Z1159 Encounter for screening for other viral diseases: Secondary | ICD-10-CM | POA: Diagnosis not present

## 2017-10-11 DIAGNOSIS — Z1231 Encounter for screening mammogram for malignant neoplasm of breast: Secondary | ICD-10-CM | POA: Diagnosis not present

## 2017-10-16 ENCOUNTER — Other Ambulatory Visit (INDEPENDENT_AMBULATORY_CARE_PROVIDER_SITE_OTHER): Payer: Self-pay | Admitting: Orthopaedic Surgery

## 2017-10-16 MED ORDER — METHYLPREDNISOLONE 4 MG PO TABS
ORAL_TABLET | ORAL | 0 refills | Status: DC
Start: 2017-10-16 — End: 2022-04-11

## 2017-10-16 MED ORDER — NABUMETONE 750 MG PO TABS: 750.0000 mg | ORAL_TABLET | Freq: Two times a day (BID) | ORAL | 1 refills | Status: DC | PRN

## 2017-10-16 MED ORDER — CYCLOBENZAPRINE HCL 10 MG PO TABS: 10.0000 mg | ORAL_TABLET | Freq: Three times a day (TID) | ORAL | 0 refills | Status: DC | PRN

## 2017-10-25 ENCOUNTER — Other Ambulatory Visit: Payer: Self-pay | Admitting: Family Medicine

## 2017-10-25 DIAGNOSIS — Z139 Encounter for screening, unspecified: Secondary | ICD-10-CM

## 2017-11-13 ENCOUNTER — Ambulatory Visit
Admission: RE | Admit: 2017-11-13 | Discharge: 2017-11-13 | Disposition: A | Discharge status: Home / Self Care | Payer: 59 | Source: Ambulatory Visit | Attending: Family Medicine | Admitting: Family Medicine

## 2017-11-13 DIAGNOSIS — Z139 Encounter for screening, unspecified: Secondary | ICD-10-CM

## 2017-11-13 DIAGNOSIS — Z1231 Encounter for screening mammogram for malignant neoplasm of breast: Secondary | ICD-10-CM | POA: Diagnosis not present

## 2018-02-22 ENCOUNTER — Ambulatory Visit (INDEPENDENT_AMBULATORY_CARE_PROVIDER_SITE_OTHER): Payer: 59 | Admitting: Family Medicine

## 2018-02-22 ENCOUNTER — Encounter: Payer: Self-pay | Admitting: Family Medicine

## 2018-02-22 DIAGNOSIS — M79672 Pain in left foot: Secondary | ICD-10-CM

## 2018-02-22 DIAGNOSIS — M79671 Pain in right foot: Secondary | ICD-10-CM | POA: Diagnosis not present

## 2018-02-22 NOTE — Patient Instructions (Signed)
Try the sports insoles with scaphoid pads. If you're breaking these in and they still feel like too much arch support you can peel the white pads out. You may need marching shoes that have a wider toebox though. Topical pads like bunion shields are a consideration to put right over where you get the calluses to prevent this also.

## 2018-02-23 ENCOUNTER — Encounter: Payer: Self-pay | Admitting: Family Medicine

## 2018-02-23 DIAGNOSIS — Z8659 Personal history of other mental and behavioral disorders: Secondary | ICD-10-CM | POA: Insufficient documentation

## 2018-02-23 NOTE — Assessment & Plan Note (Signed)
history of plantar fasciitis, right metatarsalgia.  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  Sports insoles with scaphoid pads felt more comfortable - she will try these.  Discussed may need shoes with wider toebox as well.  Bunion shields also a consideration.  Total visit time 15 minutes - >50% of which spent on counseling, answering questions.

## 2018-02-23 NOTE — Progress Notes (Signed)
PCP: Donato Schultz, DO  Subjective:   HPI: Patient is a 57 y.o. female here for custom orthotics.  04/26/16: Patient has had problems for several years with feet, ankle pain. 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 Has also been diagnosed with bilateral plantar fasciitis, right metatarsalgia in past. Pain 1/10 at worst plantar feet now. Also has right hip arthritis focally - being treated by ortho. Would like custom orthotics as these helped her ankles and feet previously. No skin changes, numbness.  02/22/18: Patient returns reporting orthotics have helped her quite a bit with her foot pain. 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 Using custom orthotics in these shoes have led to blisters medially great toe and medial 1st metatarsal pads. No other skin changes, numbness.  Past Medical History:  Diagnosis Date  . Anxiety   . Arthritis   . Depression    situation depression   . History of urinary tract infection    1998  . Multiple allergies   . Panic attacks    secondary to IV's   . Plantar fasciitis     Current Outpatient Medications on File Prior to Visit  Medication Sig Dispense Refill  . amoxicillin (AMOXIL) 500 MG tablet Take 2 tabs by mouth one hour prior to dental appointment; then take 2 tabs six hours after dental appointment. (Patient not taking: Reported on 10/20/2016) 8 tablet 0  . aspirin 81 MG chewable tablet Chew 1 tablet (81 mg total) by mouth 2 (two) times daily. (Patient not taking: Reported on 10/20/2016) 36 tablet 0  . BLACK COHOSH PO Take 540 mg by mouth daily.     . cyclobenzaprine (FLEXERIL) 10 MG tablet Take 1 tablet (10 mg total) by mouth 3 (three) times daily as needed for muscle spasms. 60 tablet 0  . gabapentin (NEURONTIN) 100 MG capsule Take 100 mg by mouth daily as needed (pain).     . methylPREDNISolone (MEDROL) 4 MG tablet Medrol dose  pack. Take as instructed 21 tablet 0  . Multiple Vitamins-Minerals (MULTIVITAMIN PO) Take 1 tablet by mouth daily.    . nabumetone (RELAFEN) 750 MG tablet Take 1 tablet (750 mg total) by mouth 2 (two) times daily as needed. 60 tablet 1  . oxyCODONE-acetaminophen (ROXICET) 5-325 MG tablet Take 1-2 tablets by mouth every 4 (four) hours as needed. (Patient not taking: Reported on 10/20/2016) 90 tablet 0   No current facility-administered medications on file prior to visit.     Past Surgical History:  Procedure Laterality Date  . APPENDECTOMY  1974  . CERVICAL CONE BIOPSY    . REFRACTIVE SURGERY  1989  . TOTAL HIP ARTHROPLASTY Right 08/10/2016   Procedure: RIGHT TOTAL HIP ARTHROPLASTY ANTERIOR APPROACH;  Surgeon: Kathryne Hitch, MD;  Location: [facility];  Service: Orthopedics;  Laterality: Right;  . WISDOM TOOTH EXTRACTION  1985    No Known Allergies  Social History   Socioeconomic History  . Marital status: Married    Spouse name: Not on file  . Number of children: Not on file  . Years of education: Not on file  . Highest education level: Not on file  Occupational History    Employer: [employer]    Comment: physical therapy  Social Needs  . Financial resource strain: Not on file  . Food insecurity:  Worry: Not on file    Inability: Not on file  . Transportation needs:    Medical: Not on file    Non-medical: Not on file  Tobacco Use  . Smoking status: Never Smoker  . Smokeless tobacco: Never Used  Substance and Sexual Activity  . Alcohol use: No  . Drug use: No  . Sexual activity: Yes    Partners: Male  Lifestyle  . Physical activity:    Days per week: Not on file    Minutes per session: Not on file  . Stress: Not on file  Relationships  . Social connections:    Talks on phone: Not on file    Gets together: Not on file    Attends religious service: Not on file    Active member of club or organization: Not on file    Attends meetings of clubs  or organizations: Not on file    Relationship status: Not on file  . Intimate partner violence:    Fear of current or ex partner: Not on file    Emotionally abused: Not on file    Physically abused: Not on file    Forced sexual activity: Not on file  Other Topics Concern  . Not on file  Social History Narrative   Exercise--gym 5-6 days a week    Family History  Problem Relation Age of Onset  . Diabetes Father 4570  . Hypertension Mother     BP (!) 128/97   Pulse 93   Ht 5\' 4"  (1.626 m)   Wt 148 lb (67.1 kg)   BMI 25.40 kg/m   Review of Systems: See HPI above.    Objective:  Physical Exam:  Gen: NAD, comfortable in exam room  Bilateral ankles/feet: Mild cavus.  No hallux valgus, rigidus.  Callus medially over plantarmedial aspect 1st MTPs and 1st digit.  No other abnormalities. FROM ankles without pain. No TTP currently. NVI distally.    Assessment & Plan:  1. Bilateral foot pain - history of plantar fasciitis, right metatarsalgia.  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  Sports insoles with scaphoid pads felt more comfortable - she will try these.  Discussed may need shoes with wider toebox as well.  Bunion shields also a consideration.  Total visit time 15 minutes - >50% of which spent on counseling, answering questions.

## 2018-06-05 DIAGNOSIS — Z23 Encounter for immunization: Secondary | ICD-10-CM | POA: Diagnosis not present

## 2018-08-09 DIAGNOSIS — Z23 Encounter for immunization: Secondary | ICD-10-CM | POA: Diagnosis not present

## 2018-10-17 ENCOUNTER — Other Ambulatory Visit: Payer: Self-pay | Admitting: Family Medicine

## 2018-10-17 DIAGNOSIS — Z1231 Encounter for screening mammogram for malignant neoplasm of breast: Secondary | ICD-10-CM

## 2018-11-21 ENCOUNTER — Ambulatory Visit: Payer: 59

## 2018-12-26 ENCOUNTER — Ambulatory Visit: Payer: 59

## 2019-04-01 ENCOUNTER — Other Ambulatory Visit: Payer: Self-pay

## 2019-04-01 ENCOUNTER — Ambulatory Visit
Admission: RE | Admit: 2019-04-01 | Discharge: 2019-04-01 | Disposition: A | Discharge status: Home / Self Care | Payer: 59 | Source: Ambulatory Visit | Attending: Family Medicine | Admitting: Family Medicine

## 2019-04-01 DIAGNOSIS — Z1231 Encounter for screening mammogram for malignant neoplasm of breast: Secondary | ICD-10-CM

## 2019-07-22 ENCOUNTER — Encounter: Payer: Self-pay | Admitting: Orthopaedic Surgery

## 2019-07-22 ENCOUNTER — Ambulatory Visit: Payer: Self-pay

## 2019-07-22 ENCOUNTER — Ambulatory Visit (INDEPENDENT_AMBULATORY_CARE_PROVIDER_SITE_OTHER): Payer: 59 | Admitting: Orthopaedic Surgery

## 2019-07-22 ENCOUNTER — Other Ambulatory Visit: Payer: Self-pay

## 2019-07-22 DIAGNOSIS — M7541 Impingement syndrome of right shoulder: Secondary | ICD-10-CM

## 2019-07-22 DIAGNOSIS — M25511 Pain in right shoulder: Secondary | ICD-10-CM

## 2019-07-22 DIAGNOSIS — G8929 Other chronic pain: Secondary | ICD-10-CM

## 2019-07-22 MED ORDER — LIDOCAINE HCL 1 % IJ SOLN
3.0000 mL | INTRAMUSCULAR | Status: AC | PRN
  Administered 2019-07-22: 3 mL

## 2019-07-22 MED ORDER — METHYLPREDNISOLONE ACETATE 40 MG/ML IJ SUSP
40.0000 mg | INTRAMUSCULAR | Status: AC | PRN
  Administered 2019-07-22: 40 mg via INTRA_ARTICULAR

## 2019-07-22 NOTE — Progress Notes (Signed)
Office Visit Note   Patient: Cynthia [name]           Date of Birth: 09/05/1960           MRN: [id number] Visit Date: 07/22/2019              Requested by: [address], [place], [place] [address] [place],  [place] [address] PCP: Carollee Herter, Alferd Apa, DO   Assessment & Plan: Visit Diagnoses:  1. Chronic right shoulder pain   2. Impingement syndrome of right shoulder     Plan: She understands that there is no much I can recommend for her IP joints of her fingers other than Voltaren gel and activity modification.  I did recommend a steroid injection in her right shoulder subacromial space.  She understands the risk and benefits of injections.  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  She did tolerate the steroid injection well in her right shoulder subacromial space.  We will see her back in 4 weeks to see how she is doing overall.  If she has not made progress we will consider an MRI of her right shoulder to rule out a rotator cuff tear based on her decreased motion and weakness with external rotation.  Follow-Up Instructions: Return in about 4 weeks (around 08/19/2019).   Orders:  Orders Placed This Encounter  Procedures  . Large Joint Inj: R subacromial bursa  . XR Shoulder Right   No orders of the defined types were placed in this encounter.     Procedures: Large Joint Inj: R subacromial bursa on 07/22/2019 10:08 AM Indications: pain and diagnostic evaluation Details: 22 G 1.5 in needle  Arthrogram: No  Medications: 3 mL lidocaine 1 %; 40 mg methylPREDNISolone acetate 40 MG/ML Outcome: tolerated well, no immediate complications Procedure, treatment alternatives, risks and benefits explained, specific risks discussed. Consent was given by the patient. Immediately prior to procedure a time out was called to verify the correct patient, procedure, equipment, support staff and site/side marked as required. Patient was prepped and draped in the  usual sterile fashion.       Clinical Data: No additional findings.   Subjective: Chief Complaint  Patient presents with  . Right Shoulder - Pain  The patient comes in today very voluntary.  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  She says reaching behind her has been the worst and her mobility is limited with that.  She also has arthritic symptoms in her IP joints of her fingers which are problematic to her.  She is tried Voltaren gel but does not really like the medicine smell Voltaren gel.  Her grip strength is getting less.  She also reports decreased right shoulder strength.  She is not a diabetic.  HPI  Review of Systems There is currently no chest pain, shortness of breath, fever, chills, nausea, vomiting  Objective: Vital Signs: LMP 04/07/2014   Physical Exam She is alert and orient x3 in no acute distress Ortho Exam Examination of her right shoulder shows the only limitation being internal rotation with adduction which is only to the lower lumbar spine on the right side when she can reach back to her thoracic spine on the left side.  Her rotator cuff itself feels strong.  Her abduction and forward flexion is full.  She has some slight weakness on external rotation on the right side.  Both hands show  changes consistent with arthritis especially at the DIP joints. Specialty Comments:  Severe/Significant Needle phobia Responds well to Ambien for sedation for procedures  Imaging: Xr Shoulder Right  Result Date: 07/22/2019 3 views of the right shoulder show no acute findings.  There is an osteophyte just above the [facility] joint.  The shoulder is well located.    PMFS History: Patient Active Problem List   Diagnosis Date Noted  . History of panic attacks 02/23/2018  . Status post total replacement of right hip 08/10/2016  . Unilateral primary osteoarthritis, right hip 08/05/2016  . Arthritis of right hip 07/15/2016  . Chronic  right-sided low back pain without sciatica 07/15/2016  . Abnormal Pap smear of cervix 06/15/2015  . Allergic rhinitis 06/15/2015  . Panic attack 06/15/2015  . Left shoulder pain 05/25/2015  . FOOT PAIN, BILATERAL 02/18/2010   Past Medical History:  Diagnosis Date  . Anxiety   . Arthritis   . Depression    situation depression   . History of urinary tract infection    1998  . Multiple allergies   . Panic attacks    secondary to IV's   . Plantar fasciitis     Family History  Problem Relation Age of Onset  . Diabetes Father 51  . Hypertension Mother     Past Surgical History:  Procedure Laterality Date  . APPENDECTOMY  1974  . CERVICAL CONE BIOPSY    . REFRACTIVE SURGERY  1989  . TOTAL HIP ARTHROPLASTY Right 08/10/2016   Procedure: RIGHT TOTAL HIP ARTHROPLASTY ANTERIOR APPROACH;  Surgeon: Kathryne Hitch, MD;  Location: [facility];  Service: Orthopedics;  Laterality: Right;  . WISDOM TOOTH EXTRACTION  1985   Social History   Occupational History    Employer: [employer]    Comment: physical therapy  Tobacco Use  . Smoking status: Never Smoker  . Smokeless tobacco: Never Used  Substance and Sexual Activity  . Alcohol use: No  . Drug use: No  . Sexual activity: Yes    Partners: Male

## 2019-08-19 ENCOUNTER — Ambulatory Visit: Payer: 59 | Admitting: Orthopaedic Surgery

## 2019-10-29 ENCOUNTER — Other Ambulatory Visit: Payer: Self-pay

## 2019-10-29 ENCOUNTER — Ambulatory Visit (INDEPENDENT_AMBULATORY_CARE_PROVIDER_SITE_OTHER): Payer: 59 | Admitting: Orthopaedic Surgery

## 2019-10-29 ENCOUNTER — Encounter: Payer: Self-pay | Admitting: Orthopaedic Surgery

## 2019-10-29 DIAGNOSIS — M25512 Pain in left shoulder: Secondary | ICD-10-CM | POA: Diagnosis not present

## 2019-10-29 MED ORDER — METHYLPREDNISOLONE ACETATE 40 MG/ML IJ SUSP
40.0000 mg | INTRAMUSCULAR | Status: AC | PRN
  Administered 2019-10-29: 40 mg via INTRA_ARTICULAR

## 2019-10-29 MED ORDER — LIDOCAINE HCL 1 % IJ SOLN
3.0000 mL | INTRAMUSCULAR | Status: AC | PRN
Start: 2019-10-29 — End: 2019-10-29
  Administered 2019-10-29: 3 mL

## 2019-10-29 NOTE — Progress Notes (Signed)
Office Visit Note   Patient: Cynthia [name]           Date of Birth: [birth year]           MRN: [id number] Visit Date: 10/29/2019              Requested by: [address] [place], [place] [address] [place],  [place] [address] PCP: Zola Button, Grayling Congress, DO   Assessment & Plan: Visit Diagnoses:  1. Acute pain of left shoulder     Plan: Since she had such a good outcome with the steroid injection in her right shoulder, I recommended this for her left shoulder and she tolerated it well.  All question concerns were answered addressed.  We did talk about her left knee and I feel this is something that just needs to be watched since her knee is otherwise stable.  If this becomes an issue she knows to come see [place].  Follow-Up Instructions: Return if symptoms worsen or fail to improve.   Orders:  Orders Placed This Encounter  Procedures  . Large Joint Inj   No orders of the defined types were placed in this encounter.     Procedures: Large Joint Inj: L subacromial bursa on 10/29/2019 10:01 AM Indications: pain and diagnostic evaluation Details: 22 G 1.5 in needle  Arthrogram: No  Medications: 3 mL lidocaine 1 %; 40 mg methylPREDNISolone acetate 40 MG/ML Outcome: tolerated well, no immediate complications Procedure, treatment alternatives, risks and benefits explained, specific risks discussed. Consent was given by the patient. Immediately prior to procedure a time out was called to verify the correct patient, procedure, equipment, support staff and site/side marked as required. Patient was prepped and draped in the usual sterile fashion.       Clinical Data: No additional findings.   Subjective: Chief Complaint  Patient presents with  . Left Shoulder - Pain  The patient is well-known to me.  We have replaced her right hip before.  She comes in today with left shoulder pain.  We actually injected her right shoulder back a few months ago.  The right shoulder had  definitely significant signs of impingement with limited motion.  She now reports an excellent outcome of the right shoulder and that injection.  She is able to obtain full motion of the shoulder.  She is now having left shoulder issues with decreased internal rotation with adduction and decreased forward flexion as well as abduction.  She is also had some issues with full flexion of her left knee and wanted me to take a look at her knee today.  She has had no other acute changes in her medical status.  The knee swells occasionally after long walks.  HPI  Review of Systems She currently denies any headache, chest pain, shortness of breath, fever, chills, nausea, vomiting  Objective: Vital Signs: LMP 04/07/2014   Physical Exam She is alert and orient x3 and in no acute distress Ortho Exam Examination of her right shoulder shows full and fluid range of motion.  Examination of left shoulder shows deficits with internal rotation abduction as well as forward flexion and abduction.  Is definitely more stiff shoulder with signs of impingement on the left side.  The right side is much improved from her last visit.  Examination of her left knee shows no effusion.  The left knee is ligamentously stable.  She can still flex to 120 degrees but cannot go much beyond that.  This  may just be result of collagen fibers and age.  There is no instability of the left knee on exam. Specialty Comments:  Severe/Significant Needle phobia Responds well to Ambien for sedation for procedures  Imaging: No results found.   PMFS History: Patient Active Problem List   Diagnosis Date Noted  . History of panic attacks 02/23/2018  . Status post total replacement of right hip 08/10/2016  . Unilateral primary osteoarthritis, right hip 08/05/2016  . Arthritis of right hip 07/15/2016  . Chronic right-sided low back pain without sciatica 07/15/2016  . Abnormal Pap smear of cervix 06/15/2015  . Allergic rhinitis 06/15/2015    . Panic attack 06/15/2015  . Left shoulder pain 05/25/2015  . FOOT PAIN, BILATERAL 02/18/2010   Past Medical History:  Diagnosis Date  . Anxiety   . Arthritis   . Depression    situation depression   . History of urinary tract infection    1998  . Multiple allergies   . Panic attacks    secondary to IV's   . Plantar fasciitis     Family History  Problem Relation Age of Onset  . Diabetes Father 89  . Hypertension Mother     Past Surgical History:  Procedure Laterality Date  . APPENDECTOMY  1974  . CERVICAL CONE BIOPSY    . REFRACTIVE SURGERY  1989  . TOTAL HIP ARTHROPLASTY Right 08/10/2016   Procedure: RIGHT TOTAL HIP ARTHROPLASTY ANTERIOR APPROACH;  Surgeon: Mcarthur Rossetti, MD;  Location: [facility];  Service: Orthopedics;  Laterality: Right;  . [place] EXTRACTION  1985   Social History   Occupational History    Employer: [employer]    Comment: physical therapy  Tobacco Use  . Smoking status: Never Smoker  . Smokeless tobacco: Never Used  Substance and Sexual Activity  . Alcohol use: No  . Drug use: No  . Sexual activity: Yes    Partners: Male

## 2020-02-26 ENCOUNTER — Other Ambulatory Visit: Payer: Self-pay | Admitting: Family Medicine

## 2020-02-26 DIAGNOSIS — Z1231 Encounter for screening mammogram for malignant neoplasm of breast: Secondary | ICD-10-CM

## 2020-04-09 ENCOUNTER — Other Ambulatory Visit: Payer: Self-pay

## 2020-04-09 ENCOUNTER — Ambulatory Visit
Admission: RE | Admit: 2020-04-09 | Discharge: 2020-04-09 | Disposition: A | Discharge status: Home / Self Care | Payer: 59 | Source: Ambulatory Visit | Attending: Family Medicine | Admitting: Family Medicine

## 2020-04-09 DIAGNOSIS — Z1231 Encounter for screening mammogram for malignant neoplasm of breast: Secondary | ICD-10-CM

## 2020-12-22 ENCOUNTER — Ambulatory Visit: Payer: 59 | Admitting: Orthopaedic Surgery

## 2020-12-28 ENCOUNTER — Ambulatory Visit (INDEPENDENT_AMBULATORY_CARE_PROVIDER_SITE_OTHER): Payer: 59 | Admitting: Orthopaedic Surgery

## 2020-12-28 DIAGNOSIS — M7541 Impingement syndrome of right shoulder: Secondary | ICD-10-CM | POA: Diagnosis not present

## 2020-12-28 MED ORDER — METHYLPREDNISOLONE ACETATE 40 MG/ML IJ SUSP
40.0000 mg | INTRAMUSCULAR | Status: AC | PRN
  Administered 2020-12-28: 40 mg via INTRA_ARTICULAR

## 2020-12-28 MED ORDER — LIDOCAINE HCL 1 % IJ SOLN
3.0000 mL | INTRAMUSCULAR | Status: AC | PRN
  Administered 2020-12-28: 3 mL

## 2020-12-28 NOTE — Progress Notes (Signed)
Office Visit Note   Patient: Cynthia [name]           Date of Birth: 10/27/1960           MRN: [id number] Visit Date: 12/28/2020              Requested by: [address] [place], [place] [address] [place],  [place] [address] PCP: Zola Button, Grayling Congress, DO   Assessment & Plan: Visit Diagnoses:  1. Impingement syndrome of right shoulder     Plan: Since it has been over a year and a half since her last steroid injection in that right shoulder subacromial outlet, I recommended a steroid injection today and she agreed to this and tolerated it well.  If her shoulder issues persist she will let us know because the neck step would be an MRI of the right shoulder.  All questions and concerns were answered and addressed.  Follow-Up Instructions: Return if symptoms worsen or fail to improve.   Orders:  Orders Placed This Encounter  Procedures  . Large Joint Inj   No orders of the defined types were placed in this encounter.     Procedures: Large Joint Inj: R subacromial bursa on 12/28/2020 10:06 AM Indications: pain and diagnostic evaluation Details: 22 G 1.5 in needle  Arthrogram: No  Medications: 3 mL lidocaine 1 %; 40 mg methylPREDNISolone acetate 40 MG/ML Outcome: tolerated well, no immediate complications Procedure, treatment alternatives, risks and benefits explained, specific risks discussed. Consent was given by the patient. Immediately prior to procedure a time out was called to verify the correct patient, procedure, equipment, support staff and site/side marked as required. Patient was prepped and draped in the usual sterile fashion.       Clinical Data: No additional findings.   Subjective: Chief Complaint  Patient presents with  . Right Shoulder - Pain  The patient is well-known to me.  I have seen her for her right shoulder and her left shoulder before.  Her right shoulder definitely has some signs of impingement.  We injected it back in November 2020  and it did well for a while.  She says her range of motion and strength are great.  She has known AC joint arthritic changes in her previous x-rays of the right shoulder show that.  There is no injury no weakness but he definitely hurts with overhead activities and reaching behind her and it does wake her up at night.  HPI  Review of Systems There is currently listed no headache, chest pain, shortness of breath, fever, chills, nausea, vomiting  Objective: Vital Signs: LMP 04/07/2014   Physical Exam She is alert and orient x3 and in no acute distress Ortho Exam Examination of her right shoulder shows fluid and full range of motion.  The strength is excellent but there is definitely signs of impingement with positive Neer and Hawkins signs. Specialty Comments:  Severe/Significant Needle phobia Responds well to Ambien for sedation for procedures  Imaging: No results found.   PMFS History: Patient Active Problem List   Diagnosis Date Noted  . History of panic attacks 02/23/2018  . Status post total replacement of right hip 08/10/2016  . Unilateral primary osteoarthritis, right hip 08/05/2016  . Arthritis of right hip 07/15/2016  . Chronic right-sided low back pain without sciatica 07/15/2016  . Abnormal Pap smear of cervix 06/15/2015  . Allergic rhinitis 06/15/2015  . Panic attack 06/15/2015  . Left shoulder pain 05/25/2015  .  FOOT PAIN, BILATERAL 02/18/2010   Past Medical History:  Diagnosis Date  . Anxiety   . Arthritis   . Depression    situation depression   . History of urinary tract infection    1998  . Multiple allergies   . Panic attacks    secondary to IV's   . Plantar fasciitis     Family History  Problem Relation Age of Onset  . Diabetes Father 53  . Hypertension Mother     Past Surgical History:  Procedure Laterality Date  . APPENDECTOMY  1974  . CERVICAL CONE BIOPSY    . REFRACTIVE SURGERY  1989  . TOTAL HIP ARTHROPLASTY Right 08/10/2016    Procedure: RIGHT TOTAL HIP ARTHROPLASTY ANTERIOR APPROACH;  Surgeon: Kathryne Hitch, MD;  Location: [facility];  Service: Orthopedics;  Laterality: Right;  . WISDOM TOOTH EXTRACTION  1985   Social History   Occupational History    Employer: [employer]    Comment: physical therapy  Tobacco Use  . Smoking status: Never Smoker  . Smokeless tobacco: Never Used  Substance and Sexual Activity  . Alcohol use: No  . Drug use: No  . Sexual activity: Yes    Partners: Male

## 2021-01-27 ENCOUNTER — Other Ambulatory Visit: Payer: Self-pay

## 2021-01-27 DIAGNOSIS — M7541 Impingement syndrome of right shoulder: Secondary | ICD-10-CM

## 2021-01-27 DIAGNOSIS — M545 Low back pain, unspecified: Secondary | ICD-10-CM

## 2021-01-28 ENCOUNTER — Telehealth: Payer: Self-pay | Admitting: Orthopaedic Surgery

## 2021-01-28 NOTE — Telephone Encounter (Signed)
Patient called. She would like PT at [facility] center. Asking that referral be sent there for her PT. Her call back number is [phone]

## 2021-01-29 NOTE — Telephone Encounter (Signed)
Referral faxed to [facility] at [phone]

## 2021-03-08 ENCOUNTER — Other Ambulatory Visit: Payer: Self-pay | Admitting: Physician Assistant

## 2021-03-08 ENCOUNTER — Other Ambulatory Visit: Payer: Self-pay | Admitting: Family Medicine

## 2021-03-08 DIAGNOSIS — Z1231 Encounter for screening mammogram for malignant neoplasm of breast: Secondary | ICD-10-CM

## 2021-04-29 ENCOUNTER — Ambulatory Visit
Admission: RE | Admit: 2021-04-29 | Discharge: 2021-04-29 | Disposition: A | Discharge status: Home / Self Care | Payer: 59 | Source: Ambulatory Visit | Attending: Physician Assistant | Admitting: Physician Assistant

## 2021-04-29 ENCOUNTER — Other Ambulatory Visit: Payer: Self-pay

## 2021-04-29 DIAGNOSIS — Z1231 Encounter for screening mammogram for malignant neoplasm of breast: Secondary | ICD-10-CM

## 2021-12-23 IMAGING — MG MM DIGITAL SCREENING BILAT W/ TOMO AND CAD
8 series · 8 of 24 positions shown · non-contrast
Comparison: Previous exam(s).

CLINICAL DATA: Screening.

EXAM:
DIGITAL SCREENING BILATERAL MAMMOGRAM WITH TOMOSYNTHESIS AND CAD
TECHNIQUE: Bilateral screening digital craniocaudal and mediolateral oblique
mammograms were obtained. Bilateral screening digital breast
tomosynthesis was performed. The images were evaluated with
computer-aided detection.

[R MLO synth-2D]
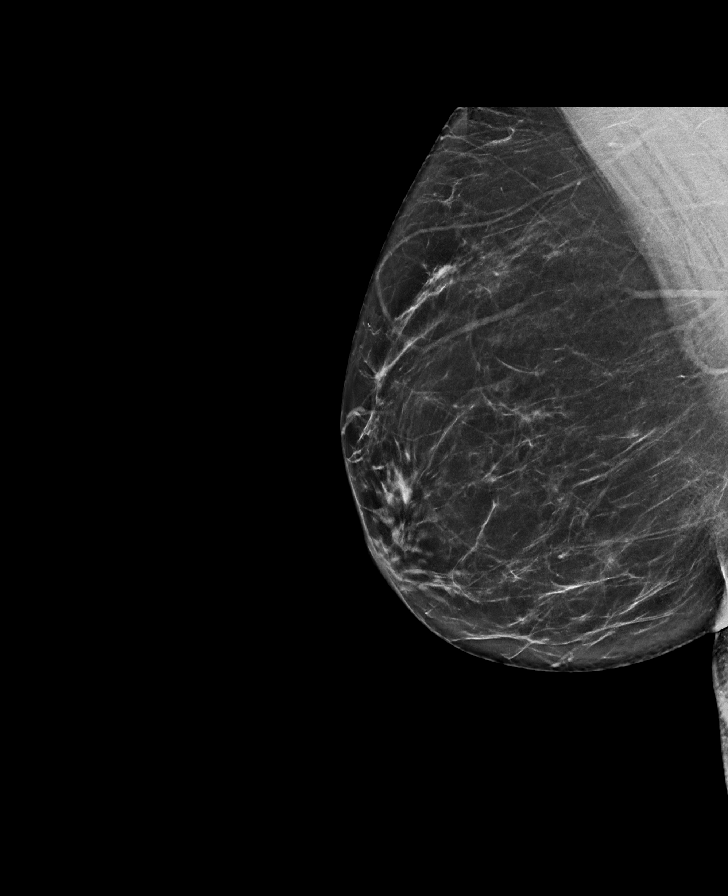

[L CC synth-2D]
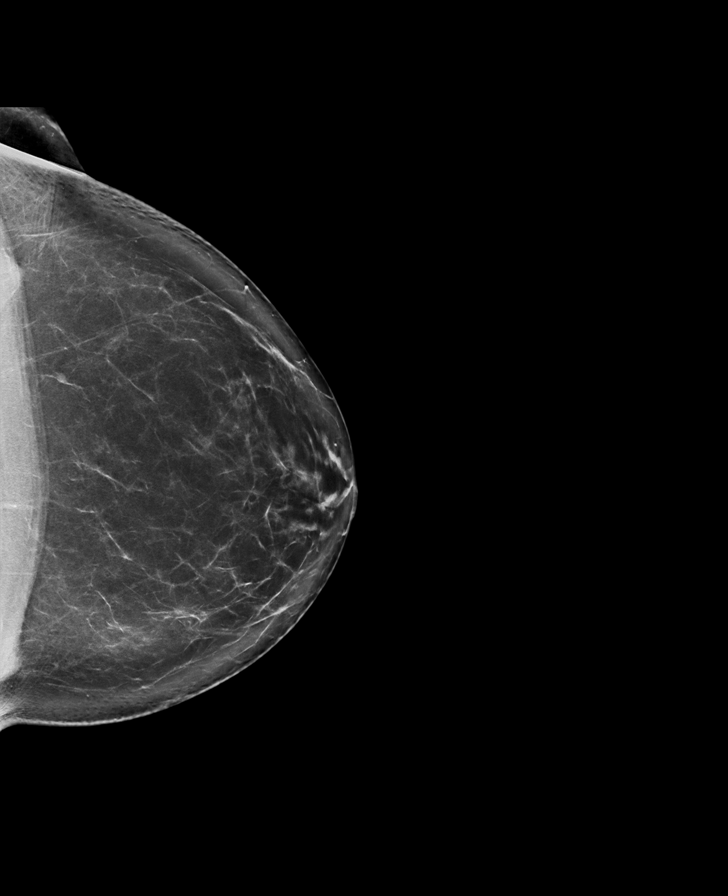

[L MLO synth-2D]
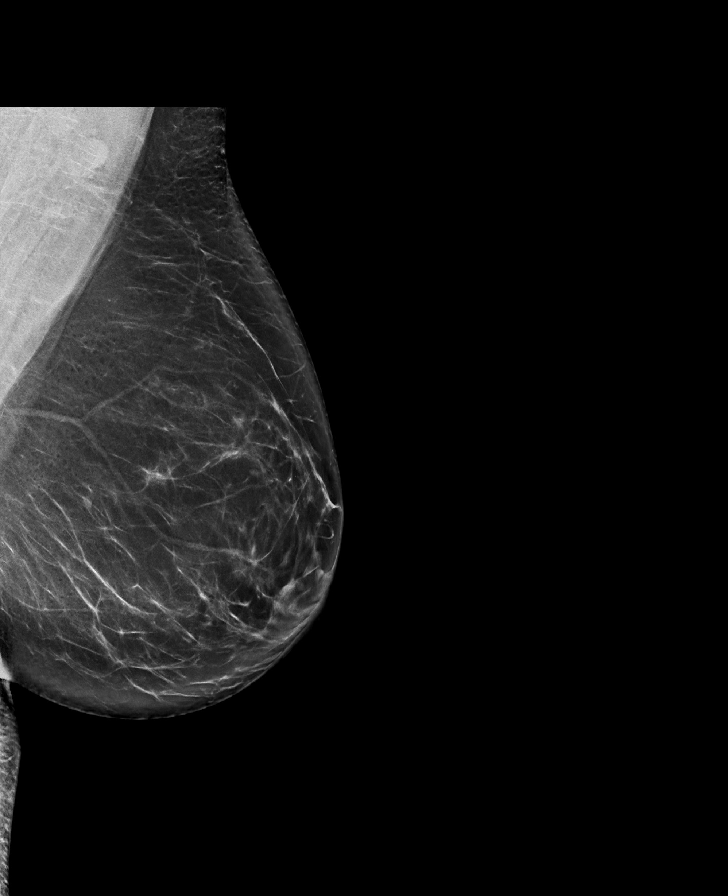

[R CC synth-2D]
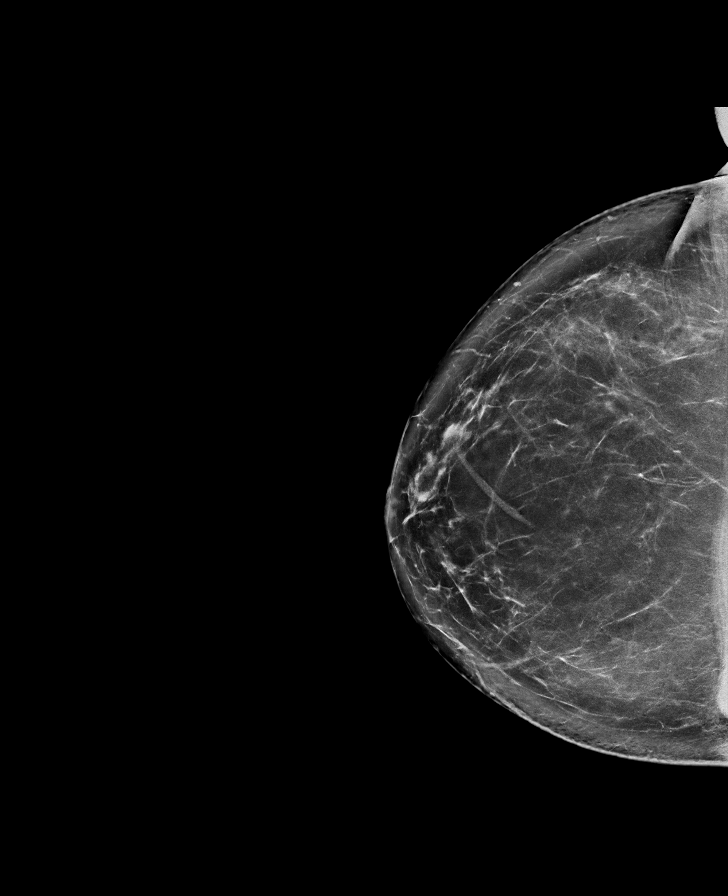

[R MLO tomo · tomo slice 40/79.0]
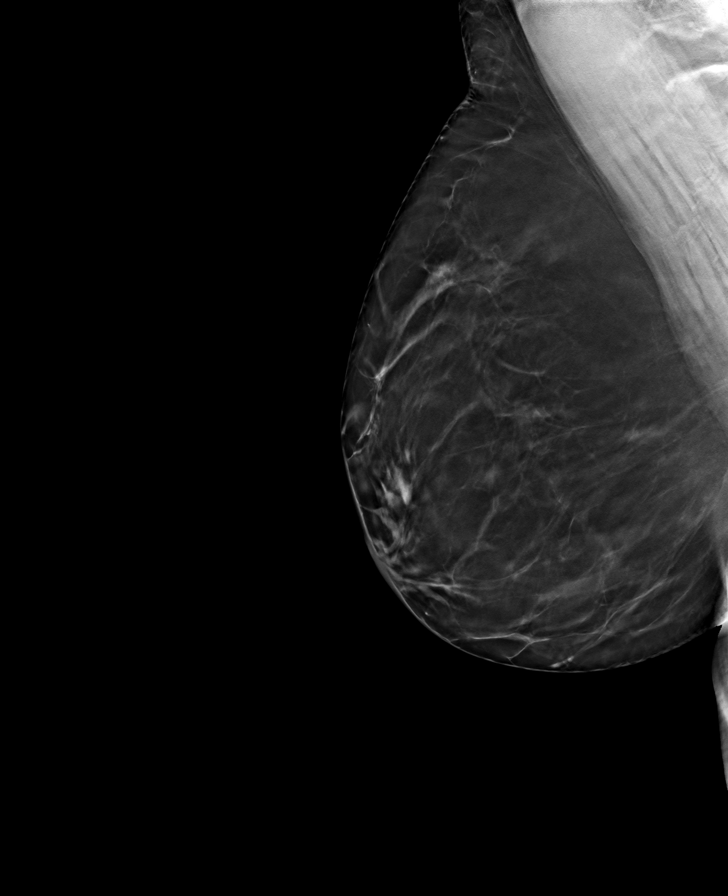

[R CC tomo · tomo slice 43/85.0]
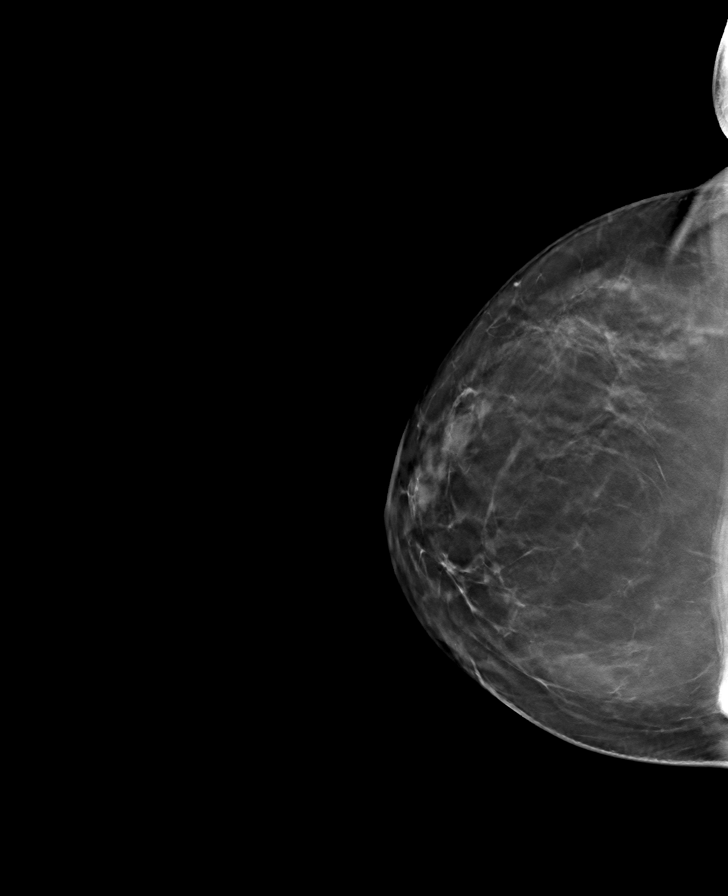

[L CC tomo · tomo slice 44/87.0]
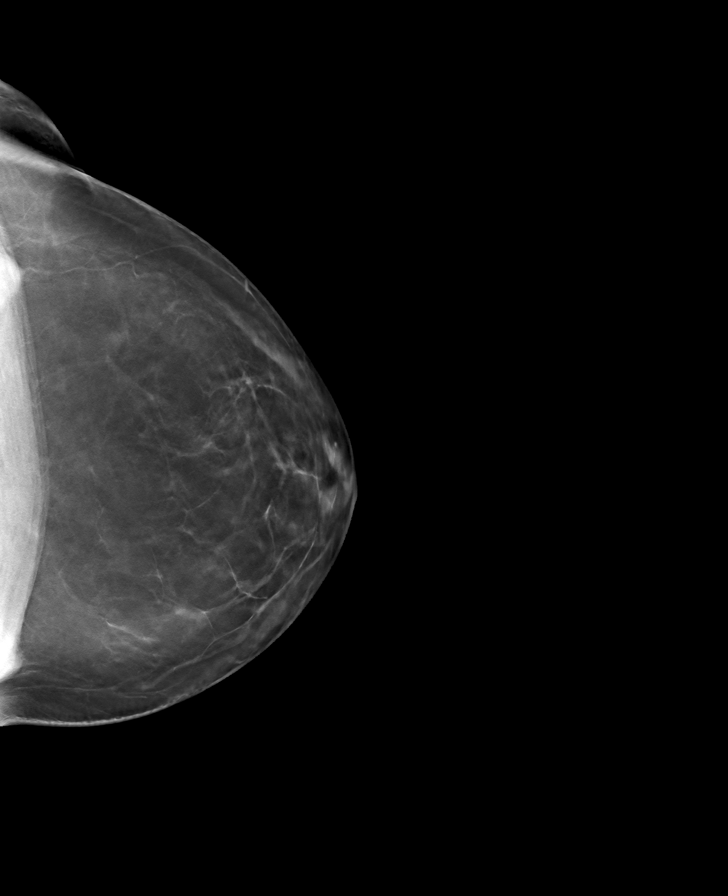

[L MLO tomo · tomo slice 44/87.0]
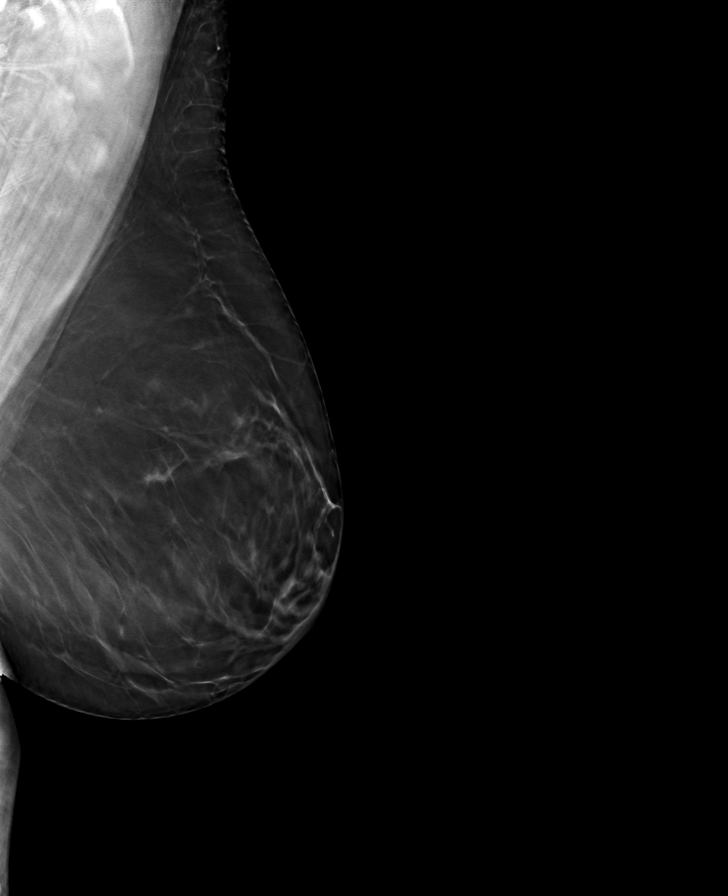

[8 of 24 positions shown; findings below may reference images not displayed]

ACR Breast Density Category b: There are scattered areas of
fibroglandular density.
FINDINGS: There are no findings suspicious for malignancy.
IMPRESSION: No mammographic evidence of malignancy. A result letter of this
screening mammogram will be mailed directly to the patient.

RECOMMENDATION:
Screening mammogram in one year. (Code:51-O-LD2)

BI-RADS CATEGORY  1: Negative.

## 2022-03-02 ENCOUNTER — Ambulatory Visit: Payer: 59 | Admitting: Physician Assistant

## 2022-03-03 ENCOUNTER — Encounter: Payer: Self-pay | Admitting: Physician Assistant

## 2022-03-03 ENCOUNTER — Ambulatory Visit (INDEPENDENT_AMBULATORY_CARE_PROVIDER_SITE_OTHER): Payer: 59

## 2022-03-03 ENCOUNTER — Ambulatory Visit (INDEPENDENT_AMBULATORY_CARE_PROVIDER_SITE_OTHER): Payer: 59 | Admitting: Physician Assistant

## 2022-03-03 DIAGNOSIS — M7541 Impingement syndrome of right shoulder: Secondary | ICD-10-CM

## 2022-03-03 DIAGNOSIS — M79672 Pain in left foot: Secondary | ICD-10-CM | POA: Diagnosis not present

## 2022-03-03 NOTE — Progress Notes (Signed)
Office Visit Note   Patient: Cynthia [name]           Date of Birth: Jul 24, 1961           MRN: [id number] Visit Date: 03/03/2022              Requested by: Verlee Rossetti, PA-C [id number] [place],  [place] [address] PCP: Verlee Rossetti, PA-C   Assessment & Plan: Visit Diagnoses:  1. Pain in left foot     Plan: Given her continued right shoulder pain despite conservative treatment recommend MRI to rule out rotator cuff tear have her back after the MRI to go over results discuss further treatment.  Regards to the left foot she is given metatarsal pads to placed his proximal second and third metatarsal heads.  Spoke to her about good shoe wear and avoiding open back shoes.  Gastrocsoleus stretching she will continue but she has been doing as she is a physical therapist.  Continue Voltaren gel up to 4 g 4 times daily to the second metatarsal head region.  Questions were encouraged and answered at length.   Follow-Up Instructions: Return for After MRI.   Orders:  Orders Placed This Encounter  Procedures   XR Foot Complete Left   No orders of the defined types were placed in this encounter.     Procedures: No procedures performed   Clinical Data: No additional findings.   Subjective: Chief Complaint  Patient presents with   Right Shoulder - Pain   Left Foot - Pain    HPI Kenijah returns today with 2 complaints 1 is her right shoulder which has been ongoing.  She states that the injection on 12/28/2020 gave her no significant relief.  She is having trouble sleeping and has to sleep with his right arm abducted or overhead to get comfortable.  She denies any numbness tingling down the arm.  She has sharp stabbing pains within the shoulder.  Radiographs of her shoulder in the past have shown some AC joint changes.  She has had no new injury to the shoulder.  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 She is also having left foot pain.  Saw podiatrist and injected her second MTP joint.  Made pain worse.  She is having pain at the base of the second toe plantar aspect.  States feels as if she is walking on a marble.  She is taking prednisone Dosepak which did not really did not help more than 2 weeks.  She is also tried Voltaren gel.  She does have orthotics but does not have them with her today.  Review of Systems See HPI otherwise negative  Objective: Vital Signs: LMP 04/07/2014   Physical Exam Constitutional:      Appearance: She is not ill-appearing or diaphoretic.  Pulmonary:     Effort: Pulmonary effort is normal.  Neurological:     Mental Status: She is alert and oriented to person, place, and time.  Psychiatric:        Mood and Affect: Mood normal.     Ortho Exam Right shoulder 5 out of 5 strength with external and internal rotation against resistance empty can test is negative bilaterally.  She has positive liftoff test on the right only.  Passive internal/external rotation reveals crepitus of the right shoulder.  Negative impingement testing bilaterally.  Bilateral feet: No rashes skin lesions ulcers or impending ulcers or significant callus.  Cavus type foot bilaterally.  Tenderness left foot only at the second metatarsal phalangeal joint. Specialty Comments:  No specialty comments available.  Imaging: XR Foot Complete Left  Result Date: 03/03/2022 Right foot 3 views: Slight Morton's type foot with second third metatarsal being longer than the first.  No acute fractures.  The sesamoids are well located.  No significant arthritic changes    PMFS History: Patient Active Problem List   Diagnosis Date Noted   History of panic attacks 02/23/2018   Status post total replacement of right hip 08/10/2016   Unilateral primary osteoarthritis, right hip 08/05/2016   Arthritis of right hip 07/15/2016   Chronic right-sided  low back pain without sciatica 07/15/2016   Abnormal Pap smear of cervix 06/15/2015   Allergic rhinitis 06/15/2015   Panic attack 06/15/2015   Left shoulder pain 05/25/2015   FOOT PAIN, BILATERAL 02/18/2010   Past Medical History:  Diagnosis Date   Anxiety    Arthritis    Depression    situation depression    History of urinary tract infection    1998   Multiple allergies    Panic attacks    secondary to IV's    Plantar fasciitis     Family History  Problem Relation Age of Onset   Diabetes Father 22   Hypertension Mother     Past Surgical History:  Procedure Laterality Date   APPENDECTOMY  1974   CERVICAL CONE BIOPSY     REFRACTIVE SURGERY  1989   TOTAL HIP ARTHROPLASTY Right 08/10/2016   Procedure: RIGHT TOTAL HIP ARTHROPLASTY ANTERIOR APPROACH;  Surgeon: Kathryne Hitch, MD;  Location: [facility];  Service: Orthopedics;  Laterality: Right;   WISDOM TOOTH EXTRACTION  1985   Social History   Occupational History    Employer: [employer]    Comment: physical therapy  Tobacco Use   Smoking status: Never   Smokeless tobacco: Never  Substance and Sexual Activity   Alcohol use: No   Drug use: No   Sexual activity: Yes    Partners: Male

## 2022-03-04 NOTE — Addendum Note (Signed)
Addended by: Barbette Or on: 03/04/2022 02:48 PM   Modules accepted: Orders

## 2022-03-29 ENCOUNTER — Other Ambulatory Visit: Payer: Self-pay | Admitting: Physician Assistant

## 2022-03-29 DIAGNOSIS — Z1231 Encounter for screening mammogram for malignant neoplasm of breast: Secondary | ICD-10-CM

## 2022-04-06 ENCOUNTER — Other Ambulatory Visit: Payer: 59

## 2022-04-07 ENCOUNTER — Ambulatory Visit
Admission: RE | Admit: 2022-04-07 | Discharge: 2022-04-07 | Disposition: A | Discharge status: Home / Self Care | Payer: 59 | Source: Ambulatory Visit | Attending: Physician Assistant | Admitting: Physician Assistant

## 2022-04-07 DIAGNOSIS — M7541 Impingement syndrome of right shoulder: Secondary | ICD-10-CM

## 2022-04-11 ENCOUNTER — Encounter: Payer: Self-pay | Admitting: Physician Assistant

## 2022-04-11 ENCOUNTER — Ambulatory Visit (INDEPENDENT_AMBULATORY_CARE_PROVIDER_SITE_OTHER): Payer: 59 | Admitting: Physician Assistant

## 2022-04-11 DIAGNOSIS — M79672 Pain in left foot: Secondary | ICD-10-CM | POA: Diagnosis not present

## 2022-04-11 MED ORDER — METHYLPREDNISOLONE 4 MG PO TABS
ORAL_TABLET | ORAL | 0 refills | Status: DC
Start: 2022-04-11 — End: 2022-12-05

## 2022-04-11 NOTE — Progress Notes (Signed)
HPI: Cynthia [name] returns today to go over the MRI of her right shoulder.  She is still having pain in the shoulder with certain movements especially external right shoulder.  No new injuries. MRI images reviewed with the patient.  Moderate tendinosis supraspinatus tendon with small interstitial tear.  Severe tendinosis involving the infraspinatus tendon with tiny interstitial tears.  Partial cartilage loss along the glenohumeral joint.  Subchondral reactive marrow changes noted.  Superior labral tear anterior to posterior.  AC joint arthritic changes.  Physical exam: Right shoulder: No pain with abduction of the arm across chest.  She has discomfort with external rotation.  5 out of 5 strength with external and internal rotation bilateral shoulders.  Right shoulder.  Impression: Right shoulder impingement/tendinosis  Plan: The patient would like to avoid surgery if at all possible.  She does note she still having some pain in the metatarsal head region over the left foot with ambulation.  I did examine her foot today and she has no tenderness with palpation which is improved.  Therefore we will place her on Medrol Dosepak for 6 days.  She will think about possible right shoulder arthroscopy with extensive debridement SAD and DCR.  She will call us if she wants to undergo surgery.  Otherwise she will follow-up with [place] as needed.  Questions were encouraged and answered.

## 2022-05-04 ENCOUNTER — Ambulatory Visit
Admission: RE | Admit: 2022-05-04 | Discharge: 2022-05-04 | Disposition: A | Discharge status: Home / Self Care | Payer: 59 | Source: Ambulatory Visit | Attending: Physician Assistant | Admitting: Physician Assistant

## 2022-05-04 DIAGNOSIS — Z1231 Encounter for screening mammogram for malignant neoplasm of breast: Secondary | ICD-10-CM

## 2022-06-16 ENCOUNTER — Other Ambulatory Visit: Payer: Self-pay | Admitting: Orthopaedic Surgery

## 2022-06-16 DIAGNOSIS — M7541 Impingement syndrome of right shoulder: Secondary | ICD-10-CM | POA: Diagnosis not present

## 2022-06-16 MED ORDER — OXYCODONE HCL 5 MG PO TABS: 5.0000 mg | ORAL_TABLET | Freq: Four times a day (QID) | ORAL | 0 refills | Status: DC | PRN

## 2022-06-23 ENCOUNTER — Encounter: Payer: Self-pay | Admitting: Orthopaedic Surgery

## 2022-06-23 ENCOUNTER — Ambulatory Visit (INDEPENDENT_AMBULATORY_CARE_PROVIDER_SITE_OTHER): Payer: 59 | Admitting: Orthopaedic Surgery

## 2022-06-23 DIAGNOSIS — M7541 Impingement syndrome of right shoulder: Secondary | ICD-10-CM

## 2022-06-23 DIAGNOSIS — Z9889 Other specified postprocedural states: Secondary | ICD-10-CM

## 2022-06-23 NOTE — Progress Notes (Signed)
Cynthia [name] comes in today for first postoperative visit status post a right shoulder arthroscopy with extensive debridement and subacromial decompression.  We did find moderate arthritis at the inferior aspect of the glenohumeral joint.  There was significant flamed tissue in the rotator interval.  Most of her issues were in the subacromial outlet with significant tendinosis of the rotator cuff and spurring under the acromion as well as significant bursitis.  We were successful in terms of getting all of this cleaned out of her shoulder.  She is really surpassed what most people are at the first postoperative visit a week after surgery.  She is already moving her shoulder well and is doing well overall.  The sutures been removed from her incisions.  She does demonstrate good range of motion of the shoulder.  There is bruising to be expected.  She will slowly increase her activities with that shoulder as comfort allows.  She is doing well enough not to need outpatient physical therapy.  I would like to see her back in 4 weeks to make sure she is doing well.  All question concerns were answered and addressed.

## 2022-07-12 ENCOUNTER — Ambulatory Visit (INDEPENDENT_AMBULATORY_CARE_PROVIDER_SITE_OTHER): Payer: 59 | Admitting: Orthopaedic Surgery

## 2022-07-12 ENCOUNTER — Encounter: Payer: Self-pay | Admitting: Orthopaedic Surgery

## 2022-07-12 DIAGNOSIS — Z9889 Other specified postprocedural states: Secondary | ICD-10-CM

## 2022-07-12 NOTE — Progress Notes (Signed)
HPI: [name] returns today status post right shoulder arthroscopy 06/16/2022.  She states overall she is improving.  She is doing home exercise overall.  She is a physical therapist.  She states she is little stiff in the morning.  She still has somewhat limited internal rotation.  States her pain can be 8 out of 10 at worst but this is short-lived and becoming less frequent.  Physical exam: Right shoulder port sites all healing well.  Full forward flexion.  Slightly limited internal rotation compared to the left.  Otherwise excellent range of motion without pain.  Impression: Status post right shoulder arthroscopy 06/16/2022  Plan: She will continue to work on range of motion strengthening.  Follow-up with [place] on as-needed basis.  Questions were encouraged and answered by Dr. Magnus [name] and myself.

## 2022-11-03 ENCOUNTER — Encounter: Payer: Self-pay | Admitting: Radiology

## 2022-12-05 ENCOUNTER — Other Ambulatory Visit: Payer: Self-pay | Admitting: Orthopaedic Surgery

## 2022-12-05 MED ORDER — METHYLPREDNISOLONE 4 MG PO TABS: ORAL_TABLET | ORAL | 0 refills | Status: DC

## 2022-12-05 MED ORDER — METHOCARBAMOL 500 MG PO TABS: 500.0000 mg | ORAL_TABLET | Freq: Four times a day (QID) | ORAL | 1 refills | Status: DC | PRN

## 2022-12-15 ENCOUNTER — Encounter: Payer: Self-pay | Admitting: Physician Assistant

## 2022-12-15 ENCOUNTER — Ambulatory Visit (INDEPENDENT_AMBULATORY_CARE_PROVIDER_SITE_OTHER): Payer: 59 | Admitting: Physician Assistant

## 2022-12-15 ENCOUNTER — Other Ambulatory Visit: Payer: Self-pay

## 2022-12-15 ENCOUNTER — Ambulatory Visit (INDEPENDENT_AMBULATORY_CARE_PROVIDER_SITE_OTHER): Payer: 59 | Admitting: Sports Medicine

## 2022-12-15 DIAGNOSIS — M25511 Pain in right shoulder: Secondary | ICD-10-CM

## 2022-12-15 DIAGNOSIS — G8929 Other chronic pain: Secondary | ICD-10-CM | POA: Diagnosis not present

## 2022-12-15 MED ORDER — METHYLPREDNISOLONE ACETATE 40 MG/ML IJ SUSP
80.0000 mg | INTRAMUSCULAR | Status: AC | PRN
Start: 2022-12-15 — End: 2022-12-15
  Administered 2022-12-15: 80 mg via INTRA_ARTICULAR

## 2022-12-15 MED ORDER — BUPIVACAINE HCL 0.25 % IJ SOLN
2.0000 mL | INTRAMUSCULAR | Status: AC | PRN
  Administered 2022-12-15: 2 mL via INTRA_ARTICULAR

## 2022-12-15 MED ORDER — LIDOCAINE HCL 1 % IJ SOLN
2.0000 mL | INTRAMUSCULAR | Status: AC | PRN
  Administered 2022-12-15: 2 mL

## 2022-12-15 NOTE — Progress Notes (Signed)
Jannet Mantis       Q   Office Visit Note   Patient: Cynthia [name]           Date of Birth: [birth year]           MRN: [id number] Visit Date: 12/15/2022              Requested by: Verlee Rossetti, PA-C [id number] [place],  [place] [address] PCP: Verlee Rossetti, PA-C   Assessment & Plan: Visit Diagnoses:  1. Chronic right shoulder pain     Plan: Will have her undergo an intra articular injection right shoulder with Dr. Shon Baton today.  Then see her back in 2 weeks see what type of results she had.  Also to be examined range of motion.  Questions were encouraged and answered at length.   Follow-Up Instructions: Return in about 2 weeks (around 12/29/2022).   Orders:  No orders of the defined types were placed in this encounter.  No orders of the defined types were placed in this encounter.     Procedures: No procedures performed   Clinical Data: No additional findings.   Subjective: Chief Complaint  Patient presents with   Right Shoulder - Pain    HPI Cynthia [name] comes in today for right shoulder pain.  She is status post right shoulder arthroscopy 06/16/2022.  She underwent extensive debridement and subacromial decompression.  She she was found to have moderate arthritis at the inferior aspect of the glenohumeral joint.  She is now having increasing pain that is been gradual in onset and decreased range of motion and strength of the right shoulder.  No particular injury.  She tried a Dosepak which helped while she was taking.  She also tried muscle relaxant and Advil.  She is having some difficulty sleeping due to the pain in the shoulder.  Review of Systems  Constitutional:  Negative for chills and fever.     Objective: Vital Signs: LMP 04/07/2014   Physical Exam Constitutional:      Appearance: She is not ill-appearing or diaphoretic.  Pulmonary:     Effort: Pulmonary effort is normal.  Neurological:     Mental Status: She is alert.  Psychiatric:        Mood  and Affect: Mood normal.     Ortho Exam Bilateral shoulders 5 5 strength external and internal rotation against resistance.  Empty can test is negative bilaterally.  Impingement testing negative bilaterally.  She is able to bring her arm fully overhead but at about 160 degrees she has significant discomfort and has to stop and then is able to ratchet the arm up the rest of the way to 180 degrees.  Liftoff test is negative bilaterally.  Diminished internal rotation right shoulder compared to left.  Specialty Comments:  No specialty comments available.  Imaging: No results found.   PMFS History: Patient Active Problem List   Diagnosis Date Noted   History of panic attacks 02/23/2018   Status post total replacement of right hip 08/10/2016   Unilateral primary osteoarthritis, right hip 08/05/2016   Arthritis of right hip 07/15/2016   Chronic right-sided low back pain without sciatica 07/15/2016   Abnormal Pap smear of cervix 06/15/2015   Allergic rhinitis 06/15/2015   Panic attack 06/15/2015   Left shoulder pain 05/25/2015   FOOT PAIN, BILATERAL 02/18/2010   Past Medical History:  Diagnosis Date   Anxiety    Arthritis    Depression    situation depression  History of urinary tract infection    1998   Multiple allergies    Panic attacks    secondary to IV's    Plantar fasciitis     Family History  Problem Relation Age of Onset   Diabetes Father 50   Hypertension Mother     Past Surgical History:  Procedure Laterality Date   APPENDECTOMY  1974   CERVICAL CONE BIOPSY     REFRACTIVE SURGERY  1989   TOTAL HIP ARTHROPLASTY Right 08/10/2016   Procedure: RIGHT TOTAL HIP ARTHROPLASTY ANTERIOR APPROACH;  Surgeon: Kathryne Hitch, MD;  Location: [facility];  Service: Orthopedics;  Laterality: Right;   WISDOM TOOTH EXTRACTION  1985   Social History   Occupational History    Employer: [employer]    Comment: physical therapy  Tobacco Use   Smoking  status: Never   Smokeless tobacco: Never  Substance and Sexual Activity   Alcohol use: No   Drug use: No   Sexual activity: Yes    Partners: Male

## 2022-12-15 NOTE — Progress Notes (Signed)
   Procedure Note  Patient: Cynthia [name]             Date of Birth: 11/05/1960           MRN: [id number]             Visit Date: 12/15/2022  Procedures: Visit Diagnoses:  1. Chronic right shoulder pain    Large Joint Inj: R glenohumeral on 12/15/2022 3:10 PM Indications: pain Details: 22 G 3.5 in needle, ultrasound-guided posterior approach Medications: 2 mL lidocaine 1 %; 2 mL bupivacaine 0.25 %; 80 mg methylPREDNISolone acetate 40 MG/ML Outcome: tolerated well, no immediate complications Procedure, treatment alternatives, risks and benefits explained, specific risks discussed. Consent was given by the patient. Immediately prior to procedure a time out was called to verify the correct patient, procedure, equipment, support staff and site/side marked as required. Patient was prepped and draped in the usual sterile fashion.    - I evaluated the patient about 10 minutes post-injection and she had improvement in pain and range of motion - follow-up with Dr. Magnus Ivan and Bronson Curb as indicated in 2 weeks; I am happy to see them as needed  Madelyn Brunner, DO Primary Care Sports Medicine Physician  [facility]  This note was dictated using Dragon naturally speaking software and may contain errors in syntax, spelling, or content which have not been identified prior to signing this note.

## 2022-12-29 ENCOUNTER — Ambulatory Visit (INDEPENDENT_AMBULATORY_CARE_PROVIDER_SITE_OTHER): Payer: 59 | Admitting: Sports Medicine

## 2022-12-29 ENCOUNTER — Encounter: Payer: Self-pay | Admitting: Sports Medicine

## 2022-12-29 DIAGNOSIS — M19011 Primary osteoarthritis, right shoulder: Secondary | ICD-10-CM

## 2022-12-29 DIAGNOSIS — M25511 Pain in right shoulder: Secondary | ICD-10-CM | POA: Diagnosis not present

## 2022-12-29 DIAGNOSIS — G8929 Other chronic pain: Secondary | ICD-10-CM

## 2022-12-29 NOTE — Progress Notes (Signed)
Cynthia [name] - 62 y.o. female MRN [id number]  Date of birth: 03-03-1961  Office Visit Note: Visit Date: 12/29/2022 PCP: Verlee Rossetti, PA-C Referred by: Verlee Rossetti, PA-C  Subjective: Chief Complaint  Patient presents with   Right Shoulder - Follow-up   HPI: Cynthia [name] is a very pleasant 62 y.o. female who presents today for follow-up of right shoulder pain.  She is status post right shoulder arthroscopy 06/16/2022 with Dr. Magnus Ivan. We did proceed with ultrasound-guided glenohumeral joint injection on 12/15/2022 -she had rather good pain relief and improved range of motion following this.  Felt like she was at least 80% better.  Here over the last few days has started to slowly wean but still much improved and able to sleep through the night.  Reports improved range of motion.  Pertinent ROS were reviewed with the patient and found to be negative unless otherwise specified above in HPI.   Assessment & Plan: Visit Diagnoses:  1. Chronic right shoulder pain   2. Primary osteoarthritis, right shoulder    Plan: Discussed with Cynthia [name] that we are both pleased she had such good improvement from the intra-articular shoulder injection.  Her relief this point to this coming from within the joint itself.  I would like her to continue her rotator cuff and shoulder strengthening exercises at home and in the gym.  Discussed we can always consider infrequent repeat shoulder injections only as her pain requires.  May use over-the-counter anti-inflammatories as needed.  She will follow-up with Dr. Sabino Gasser or myself as needed.  Follow-up: Return if symptoms worsen or fail to improve, for WIth Gil/Blackman or myself.   Meds & Orders: No orders of the defined types were placed in this encounter.  No orders of the defined types were placed in this encounter.    Procedures: No procedures performed      Clinical History: No specialty comments available.  She reports that she has  never smoked. She has never used smokeless tobacco. No results for input(s): "HGBA1C", "LABURIC" in the last 8760 hours.  Objective:   Vital Signs: LMP 04/07/2014   Physical Exam  Gen: Well-appearing, in no acute distress; non-toxic CV: Well-perfused. Warm.  Resp: Breathing unlabored on room air; no wheezing. Psych: Fluid speech in conversation; appropriate affect; normal thought process Neuro: Sensation intact throughout. No gross coordination deficits.   Ortho Exam - Right shoulder: No bony tenderness.  There is full active range of motion comparable to the contralateral shoulder.  Does have very mild pain with end range abduction.  No gross weakness with rotator cuff testing.  There is slightly diminished internal rotation of the right shoulder compared to the left.  Neurovascular intact distally.  Imaging: No results found.  Past Medical/Family/Surgical/Social History: Medications & Allergies reviewed per EMR, new medications updated. Patient Active Problem List   Diagnosis Date Noted   History of panic attacks 02/23/2018   Status post total replacement of right hip 08/10/2016   Unilateral primary osteoarthritis, right hip 08/05/2016   Arthritis of right hip 07/15/2016   Chronic right-sided low back pain without sciatica 07/15/2016   Abnormal Pap smear of cervix 06/15/2015   Allergic rhinitis 06/15/2015   Panic attack 06/15/2015   Left shoulder pain 05/25/2015   FOOT PAIN, BILATERAL 02/18/2010   Past Medical History:  Diagnosis Date   Anxiety    Arthritis    Depression    situation depression    History of urinary tract infection    1998  Multiple allergies    Panic attacks    secondary to IV's    Plantar fasciitis    Family History  Problem Relation Age of Onset   Diabetes Father 81   Hypertension Mother    Past Surgical History:  Procedure Laterality Date   APPENDECTOMY  1974   CERVICAL CONE BIOPSY     REFRACTIVE SURGERY  1989   TOTAL HIP ARTHROPLASTY  Right 08/10/2016   Procedure: RIGHT TOTAL HIP ARTHROPLASTY ANTERIOR APPROACH;  Surgeon: Kathryne Hitch, MD;  Location: [facility];  Service: Orthopedics;  Laterality: Right;   WISDOM TOOTH EXTRACTION  1985   Social History   Occupational History    Employer: [employer]    Comment: physical therapy  Tobacco Use   Smoking status: Never   Smokeless tobacco: Never  Substance and Sexual Activity   Alcohol use: No   Drug use: No   Sexual activity: Yes    Partners: Male

## 2022-12-29 NOTE — Progress Notes (Signed)
Was about 80% better day after injection. States the pain is still there but not as bad. States she is about 60-70% better now. ROM improved Is finally able to sleep thru the night

## 2023-04-04 ENCOUNTER — Other Ambulatory Visit: Payer: Self-pay | Admitting: Physician Assistant

## 2023-04-04 DIAGNOSIS — Z1231 Encounter for screening mammogram for malignant neoplasm of breast: Secondary | ICD-10-CM

## 2023-04-23 ENCOUNTER — Other Ambulatory Visit: Payer: Self-pay | Admitting: Orthopaedic Surgery

## 2023-04-23 MED ORDER — PREDNISONE 50 MG PO TABS: ORAL_TABLET | ORAL | 0 refills | Status: DC

## 2023-04-23 MED ORDER — TRAMADOL HCL 50 MG PO TABS: 100.0000 mg | ORAL_TABLET | Freq: Four times a day (QID) | ORAL | 0 refills | Status: DC | PRN

## 2023-05-16 ENCOUNTER — Ambulatory Visit
Admission: RE | Admit: 2023-05-16 | Discharge: 2023-05-16 | Disposition: A | Discharge status: Home / Self Care | Payer: 59 | Source: Ambulatory Visit | Attending: Physician Assistant | Admitting: Physician Assistant

## 2023-05-16 DIAGNOSIS — Z1231 Encounter for screening mammogram for malignant neoplasm of breast: Secondary | ICD-10-CM

## 2023-07-17 ENCOUNTER — Other Ambulatory Visit: Payer: Self-pay | Admitting: Orthopaedic Surgery

## 2023-07-17 MED ORDER — METHOCARBAMOL 500 MG PO TABS: 500.0000 mg | ORAL_TABLET | Freq: Four times a day (QID) | ORAL | 1 refills | Status: AC | PRN

## 2024-01-24 ENCOUNTER — Other Ambulatory Visit: Payer: Self-pay | Admitting: Orthopaedic Surgery

## 2024-01-24 MED ORDER — PREDNISONE 50 MG PO TABS: ORAL_TABLET | ORAL | 0 refills | Status: AC

## 2024-01-25 ENCOUNTER — Encounter: Payer: Self-pay | Admitting: Sports Medicine

## 2024-01-25 ENCOUNTER — Other Ambulatory Visit (INDEPENDENT_AMBULATORY_CARE_PROVIDER_SITE_OTHER): Payer: Self-pay

## 2024-01-25 ENCOUNTER — Other Ambulatory Visit: Payer: Self-pay

## 2024-01-25 ENCOUNTER — Ambulatory Visit: Admitting: Sports Medicine

## 2024-01-25 DIAGNOSIS — M545 Low back pain, unspecified: Secondary | ICD-10-CM

## 2024-01-25 DIAGNOSIS — M7918 Myalgia, other site: Secondary | ICD-10-CM | POA: Diagnosis not present

## 2024-01-25 DIAGNOSIS — G8929 Other chronic pain: Secondary | ICD-10-CM

## 2024-01-25 DIAGNOSIS — M357 Hypermobility syndrome: Secondary | ICD-10-CM

## 2024-01-25 DIAGNOSIS — M533 Sacrococcygeal disorders, not elsewhere classified: Secondary | ICD-10-CM

## 2024-01-25 NOTE — Progress Notes (Signed)
 Patient says that she has had hypermobile SI joints for 30 years. 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 She has had 8-10 injections, as well as other treatment in the past. She was in [place]  this past weekend, and felt her right SI joint go out when moving a mattress. She also plays in a bagpipe band, and was able to play in a parade although this parade did increase her pain. She says that she took one Prednisone  and felt okay for the 6 hour drive home, but later sneezed and her pain increased again. She denies any pain, numbness, or tingling in the leg. She has since been prescribed other medication by Dr. Lucienne Ryder.

## 2024-01-25 NOTE — Progress Notes (Signed)
 Cynthia [name] - 63 y.o. female MRN [id number]  Date of birth: [birth year]  Office Visit Note: Visit Date: 01/25/2024 PCP: Mitchell Ana, PA-C Referred by: Mitchell Ana, PA-C  Subjective: Chief Complaint  Patient presents with   Lower Back - Pain   HPI: Cynthia [name] is a pleasant 63 y.o. female who presents today for acute on chronic low back and SI-joint dysfunction.  Cynthia [name] has a long history of joint hypermobility, specifically causing her pain in the SI joint for 30+ years.  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  She has had a long history of issues with the SI joint and has had between somewhere 8-10 injections over the years, but has been quite a while since her last one has been needed.  She recently went on a trip to [place]  and was riding in the car that exacerbated her pain as well as walking in a parade playing the bagpipes.  She did take some oral prednisone  from her mother which helped but then she had 2 sneezing episodes which significantly exacerbated her pain.  She denies any numbness or tingling going down the leg.  She did start prednisone  50 mg daily that she took from Dr. Lucienne Ryder, today was her first dose.  Pertinent ROS were reviewed with the patient and found to be negative unless otherwise specified above in HPI.   Assessment & Plan: Visit Diagnoses:  1. Chronic right SI joint pain   2. Chronic right-sided low back pain without sciatica   3. Pain in right buttock   4. Benign joint hypermobility    Plan: Impression is acute on chronic right SI joint pain and dysfunction in the setting of joint hypermobility.  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  She unfortunately had a flare given her prolonged car ride and bagpipe playing in parade.  Through shared decision making, we did proceed with ultrasound-guided right SI joint  injection to help settle down her pain and inflammation, patient tolerated well with some pain during procedure.  Discussed postinjection protocol and instructions.  They use ice/heat or Tylenol  for any postinjection pain.  Given the corticosteroid injection today, I would like her to hold from taking any further prednisone  orally today and tomorrow, she may resume starting on Saturday only if she needs. F/u with me as needed.  Follow-up: Return if symptoms worsen or fail to improve.   Meds & Orders: No orders of the defined types were placed in this encounter.   Orders Placed This Encounter  Procedures   XR Lumbar Spine 2-3 Views   US  Guided Needle Placement - No Linked Charges     Procedures: U/S-guided SI-joint injection, Right   After discussion of risk/benefits/indications, informed verbal consent was obtained. A timeout was then performed. The patient was positioned in a prone position on exam room table with a pillow placed under the pelvis for mild hip flexion. The SI joint area was cleaned and prepped with betadine and alcohol swabs. Sterile ultrasound gel was applied and the ultrasound transducer was placed in an anatomic axial plane over the PSIS, then moved distally over the SI-joint. Using ultrasound guidance, a 22-gauge, 3.5" needle was inserted from a medial to lateral approach utilizing an in-plane approach and directed into the SI-joint. The SI-joint was then injected with a mixture of 4:2 lidocaine :depomedrol with visualization of the injectate flow  into the SI-joint under ultrasound visualization. The patient tolerated the procedure well with some pain during the injection but without immediate complications following. She ambulated well without issues s/p injection.       Clinical History: No specialty comments available.  She reports that she has never smoked. She has never used smokeless tobacco. No results for input(s): "HGBA1C", "LABURIC" in the last 8760  hours.  Objective:   Vital Signs: LMP 04/07/2014   Physical Exam  Gen: Well-appearing, in no acute distress; non-toxic CV: Well-perfused. Warm.  Resp: Breathing unlabored on room air; no wheezing. Psych: Fluid speech in conversation; appropriate affect; normal thought process  Ortho Exam - Low back/SI-joint: + TTP over the posterior right SI joint just inferior to the PSIS. + Fortin's point test on the right. There is associated pain with extension and endrange flexion of the lumbar spine/SI complex.   Imaging: XR Lumbar Spine 2-3 Views Result Date: 01/25/2024 2 view x-ray of the lumbar spine including AP and lateral film were ordered and reviewed by myself today.  X-rays demonstrate 5 nonrib-bearing lumbar vertebra.  There is a mild L2 on L3 anterior listhesis.  There is mild lumbar DDD within the lumbar spine.  There is right greater than left SI joint sclerosis which is mild to moderate. No acute fracture or acute bony abnormality otherwise noted.  Moderate+ stool burden noted.   Past Medical/Family/Surgical/Social History: Medications & Allergies reviewed per EMR, new medications updated. Patient Active Problem List   Diagnosis Date Noted   History of panic attacks 02/23/2018   Status post total replacement of right hip 08/10/2016   Unilateral primary osteoarthritis, right hip 08/05/2016   Arthritis of right hip 07/15/2016   Chronic right-sided low back pain without sciatica 07/15/2016   Abnormal Pap smear of cervix 06/15/2015   Allergic rhinitis 06/15/2015   Panic attack 06/15/2015   Left shoulder pain 05/25/2015   FOOT PAIN, BILATERAL 02/18/2010   Past Medical History:  Diagnosis Date   Anxiety    Arthritis    Depression    situation depression    History of urinary tract infection    1998   Multiple allergies    Panic attacks    secondary to IV's    Plantar fasciitis    Family History  Problem Relation Age of Onset   Diabetes Father 68   Hypertension Mother     Past Surgical History:  Procedure Laterality Date   APPENDECTOMY  1974   CERVICAL CONE BIOPSY     REFRACTIVE SURGERY  1989   TOTAL HIP ARTHROPLASTY Right 08/10/2016   Procedure: RIGHT TOTAL HIP ARTHROPLASTY ANTERIOR APPROACH;  Surgeon: Arnie Lao, MD;  Location: [facility];  Service: Orthopedics;  Laterality: Right;   WISDOM TOOTH EXTRACTION  1985   Social History   Occupational History    Employer: [employer]    Comment: physical therapy  Tobacco Use   Smoking status: Never   Smokeless tobacco: Never  Substance and Sexual Activity   Alcohol use: No   Drug use: No   Sexual activity: Yes    Partners: Male

## 2024-01-29 ENCOUNTER — Telehealth: Payer: Self-pay | Admitting: Sports Medicine

## 2024-01-29 NOTE — Telephone Encounter (Signed)
 Patient called. Says she is feeling great, the shot worked.

## 2024-04-26 ENCOUNTER — Other Ambulatory Visit: Payer: Self-pay | Admitting: Physician Assistant

## 2024-04-26 DIAGNOSIS — Z1231 Encounter for screening mammogram for malignant neoplasm of breast: Secondary | ICD-10-CM

## 2024-05-21 ENCOUNTER — Ambulatory Visit
Admission: RE | Admit: 2024-05-21 | Discharge: 2024-05-21 | Disposition: A | Discharge status: Home / Self Care | Source: Ambulatory Visit | Attending: Physician Assistant | Admitting: Physician Assistant

## 2024-05-21 DIAGNOSIS — Z1231 Encounter for screening mammogram for malignant neoplasm of breast: Secondary | ICD-10-CM

## 2024-07-01 ENCOUNTER — Encounter: Payer: Self-pay | Admitting: Radiology

## 2024-07-16 ENCOUNTER — Other Ambulatory Visit

## 2024-07-16 ENCOUNTER — Ambulatory Visit (INDEPENDENT_AMBULATORY_CARE_PROVIDER_SITE_OTHER): Admitting: Orthopedic Surgery

## 2024-07-16 DIAGNOSIS — M79641 Pain in right hand: Secondary | ICD-10-CM

## 2024-07-16 DIAGNOSIS — M67449 Ganglion, unspecified hand: Secondary | ICD-10-CM | POA: Diagnosis not present

## 2024-07-16 NOTE — Progress Notes (Signed)
 Cynthia [name] - 63 y.o. female MRN [id number]  Date of birth: [birth year]  Office Visit Note: Visit Date: 07/16/2024 PCP: Loris Elsie PARAS, PA-C Referred by: Loris Elsie PARAS, PA-C  Subjective: No chief complaint on file.  HPI: Cynthia [name] is a pleasant 63 y.o. female who presents today for evaluation of right long finger DIP mucous cyst has been present now for multiple years.  Describes ongoing swelling with intermittent drainage from the region, increasing pain.  She is here today to discuss potential surgical options.  Pertinent ROS were reviewed with the patient and found to be negative unless otherwise specified above in HPI.   Visit Reason: right long finger Duration of symptoms: 2 years Hand dominance: right Occupation: PT Diabetic: No Smoking: No Heart/Lung History: none Blood Thinners: none  Prior Testing/EMG: none Injections (Date): none Treatments:none Prior Surgery:none    Assessment & Plan: Visit Diagnoses:  1. Mucous cyst of digit of hand   2. Pain in right hand     Plan: Extensive discussion was had with the patient today regarding her mucous cyst of the right long finger.  I reviewed the x-rays in detail with patient today which do show diffuse DIP joint arthritis which is the underlying cause for this ongoing mucous cyst.  We discussed the underlying etiology and pathophysiology of this condition as well as treatment modalities ranging from conservative to surgical.  From a conservative standpoint, we discussed ongoing observation, Coban wrapping, activity modification and anti-inflammatory medication both topical and oral.  From a surgical standpoint, we discussed cyst excision and DIP joint debridement.  I did mention that the underlying arthritis does persist in these cases, there is a possibility for recurrence of the cyst after excision despite DIP joint debridement.  From a definitive standpoint, we discussed the possibility of DIP fusion in the future  should the mucous cyst become recurrent.   Risks and benefits of the procedure were discussed, risks including but not limited to infection, bleeding, scarring, stiffness, nerve injury, tendon injury, vascular injury, recurrence of symptoms, need for skin advancement flap and need for subsequent operation.  We also discussed the appropriate postoperative protocol and timeframe for return to activities and function.  Patient expressed understanding.     Will move forward with surgical scheduling of right long finger DIP mucous cyst excision with possible advancement flap at the next available surgical date per patient preference.   Follow-up: No follow-ups on file.   Meds & Orders: No orders of the defined types were placed in this encounter.   Orders Placed This Encounter  Procedures   XR Finger Middle Right     Procedures: No procedures performed      Clinical History: No specialty comments available.  She reports that she has never smoked. She has never used smokeless tobacco. No results for input(s): HGBA1C, LABURIC in the last 8760 hours.  Objective:   Vital Signs: LMP 04/07/2014   Physical Exam  Gen: Well-appearing, in no acute distress; non-toxic CV: Regular Rate. Well-perfused. Warm.  Resp: Breathing unlabored on room air; no wheezing. Psych: Fluid speech in conversation; appropriate affect; normal thought process  Ortho Exam Right long finger - Palpable mass over the dorsal aspect of the DIP region, measures approximately 1.5 cm x 1.5 cm, tender, compressible - DIP motion 0-45 - Nail plate is well secured beneath the eponychial fold without deformity, sensation intact distally, digit remains warm well-perfused   Imaging: XR Finger Middle Right Result Date: 07/16/2024 X-rays of the right  long finger demonstrate notable degenerative changes at the DIP level with joint space narrowing and osteophyte formation   Past Medical/Family/Surgical/Social  History: Medications & Allergies reviewed per EMR, new medications updated. Patient Active Problem List   Diagnosis Date Noted   History of panic attacks 02/23/2018   Status post total replacement of right hip 08/10/2016   Unilateral primary osteoarthritis, right hip 08/05/2016   Arthritis of right hip 07/15/2016   Chronic right-sided low back pain without sciatica 07/15/2016   Abnormal Pap smear of cervix 06/15/2015   Allergic rhinitis 06/15/2015   Panic attack 06/15/2015   Left shoulder pain 05/25/2015   FOOT PAIN, BILATERAL 02/18/2010   Past Medical History:  Diagnosis Date   Anxiety    Arthritis    Depression    situation depression    History of urinary tract infection    1998   Multiple allergies    Panic attacks    secondary to IV's    Plantar fasciitis    Family History  Problem Relation Age of Onset   Hypertension Mother    Diabetes Father 62   Breast cancer Neg Hx    Past Surgical History:  Procedure Laterality Date   APPENDECTOMY  1974   CERVICAL CONE BIOPSY     REFRACTIVE SURGERY  1989   TOTAL HIP ARTHROPLASTY Right 08/10/2016   Procedure: RIGHT TOTAL HIP ARTHROPLASTY ANTERIOR APPROACH;  Surgeon: Lonni CINDERELLA Poli, MD;  Location: [facility];  Service: Orthopedics;  Laterality: Right;   WISDOM TOOTH EXTRACTION  1985   Social History   Occupational History    Employer: [employer]    Comment: physical therapy  Tobacco Use   Smoking status: Never   Smokeless tobacco: Never  Substance and Sexual Activity   Alcohol use: No   Drug use: No   Sexual activity: Yes    Partners: Male    Gary Bultman Estela) Damaria Vachon, M.D. [facility], Hand Surgery

## 2024-07-19 ENCOUNTER — Encounter: Payer: Self-pay | Admitting: Orthopedic Surgery

## 2024-07-19 ENCOUNTER — Telehealth: Payer: Self-pay | Admitting: Orthopedic Surgery

## 2024-07-19 NOTE — Telephone Encounter (Signed)
 I spoke with the patient today and scheduled her for surgery on 08/08/24.

## 2024-07-19 NOTE — Telephone Encounter (Signed)
 I spoke with the patient today and scheduled her for surgery on 08/08/24. Patient requests rx for antibiotics be called in now to pharmacy so she can have them before surgery. Patient uses [facility] on [address].

## 2024-07-23 ENCOUNTER — Other Ambulatory Visit: Payer: Self-pay | Admitting: Orthopedic Surgery

## 2024-07-23 MED ORDER — CEPHALEXIN 500 MG PO CAPS: 500.0000 mg | ORAL_CAPSULE | Freq: Two times a day (BID) | ORAL | 0 refills | Status: AC

## 2024-07-29 ENCOUNTER — Encounter: Payer: Self-pay | Admitting: Sports Medicine

## 2024-07-31 NOTE — Telephone Encounter (Signed)
 done

## 2024-08-07 ENCOUNTER — Other Ambulatory Visit: Payer: Self-pay | Admitting: Orthopedic Surgery

## 2024-08-07 MED ORDER — TRAMADOL HCL 50 MG PO TABS: 50.0000 mg | ORAL_TABLET | Freq: Four times a day (QID) | ORAL | 0 refills | Status: AC | PRN

## 2024-08-08 DIAGNOSIS — M67441 Ganglion, right hand: Secondary | ICD-10-CM

## 2024-08-14 ENCOUNTER — Ambulatory Visit: Admitting: Orthopedic Surgery

## 2024-08-14 DIAGNOSIS — M67449 Ganglion, unspecified hand: Secondary | ICD-10-CM

## 2024-08-14 NOTE — Progress Notes (Unsigned)
° °  Merita Hawks - 63 y.o. female MRN [id number]  Date of birth: Aug 12, 1961  Office Visit Note: Visit Date: 08/14/2024 PCP: Loris Elsie PARAS, PA-C Referred by: Loris Elsie PARAS, PA-C  Subjective:  HPI: Breda Bond is a 64 y.o. female who presents today for follow up 1 week status post right long finger DIP mucous cyst excision. Right long finger DIP joint debridement. Right long finger skin flap advancement, 3cmx1.5cm.  Pertinent ROS were reviewed with the patient and found to be negative unless otherwise specified above in HPI.   Assessment & Plan: Visit Diagnoses: No diagnosis found.  Plan: ***  Follow-up: No follow-ups on file.   Meds & Orders: No orders of the defined types were placed in this encounter.  No orders of the defined types were placed in this encounter.    Procedures: No procedures performed       Objective:   Vital Signs: LMP 04/07/2014   Ortho Exam ***  Imaging: No results found.   Lesleyanne Politte Afton Alderton, M.D. [facility], Hand Surgery

## 2024-08-26 NOTE — Progress Notes (Unsigned)
" ° °  Cynthia [name] - 63 y.o. female MRN [id number]  Date of birth: [birth year]  Office Visit Note: Visit Date: 08/27/2024 PCP: Loris Elsie PARAS, PA-C Referred by: Loris Elsie PARAS, PA-C  Subjective:  HPI: Cynthia [name] is a 63 y.o. female who presents today for follow up 3 weeks status post right long finger DIP mucous cyst excision. Right long finger DIP joint debridement. Right long finger skin flap advancement, 3cmx1.5cm. Doing well overall, well healing incision.  Pertinent ROS were reviewed with the patient and found to be negative unless otherwise specified above in HPI.   Assessment & Plan: Visit Diagnoses:  1. Mucous cyst of digit of hand     Plan: She is doing well today, incision demonstrates appropriate healing. Sutures are removed today. She can follow up with us  in 6 weeks or sooner if needed.  Follow-up: No follow-ups on file.   Meds & Orders: No orders of the defined types were placed in this encounter.  No orders of the defined types were placed in this encounter.    Procedures: No procedures performed       Objective:   Vital Signs: LMP 04/07/2014   Ortho Exam Right long finger well-healing dorsal incision, notable eschar over the incisional site, digit remains war well-perfused  Imaging: No results found.   Cynthia [name], M.D. [facility], Hand Surgery  "

## 2024-08-27 ENCOUNTER — Ambulatory Visit: Admitting: Orthopedic Surgery

## 2024-08-27 DIAGNOSIS — M67449 Ganglion, unspecified hand: Secondary | ICD-10-CM

## 2024-08-28 ENCOUNTER — Encounter: Admitting: Orthopedic Surgery
